# Patient Record
Sex: Male | Born: 1950
Health system: Southern US, Community
[De-identification: ages and names within clinical notes are randomized; demographics above are authoritative.]

## PROBLEM LIST (undated history)

## (undated) DIAGNOSIS — N189 Chronic kidney disease, unspecified: Secondary | ICD-10-CM

## (undated) DIAGNOSIS — Z87442 Personal history of urinary calculi: Secondary | ICD-10-CM

## (undated) DIAGNOSIS — I509 Heart failure, unspecified: Secondary | ICD-10-CM

## (undated) DIAGNOSIS — K469 Unspecified abdominal hernia without obstruction or gangrene: Secondary | ICD-10-CM

## (undated) DIAGNOSIS — F4310 Post-traumatic stress disorder, unspecified: Secondary | ICD-10-CM

## (undated) DIAGNOSIS — G4730 Sleep apnea, unspecified: Secondary | ICD-10-CM

## (undated) DIAGNOSIS — I10 Essential (primary) hypertension: Secondary | ICD-10-CM

## (undated) DIAGNOSIS — D649 Anemia, unspecified: Secondary | ICD-10-CM

## (undated) DIAGNOSIS — R0600 Dyspnea, unspecified: Secondary | ICD-10-CM

## (undated) DIAGNOSIS — K219 Gastro-esophageal reflux disease without esophagitis: Secondary | ICD-10-CM

## (undated) DIAGNOSIS — R011 Cardiac murmur, unspecified: Secondary | ICD-10-CM

## (undated) HISTORY — PX: AV FISTULA PLACEMENT: SHX1204

## (undated) HISTORY — PX: COLONOSCOPY: SHX174

## (undated) HISTORY — PX: CARDIAC CATHETERIZATION: SHX172

## (undated) HISTORY — PX: INSERTION OF DIALYSIS CATHETER: SHX1324

---

## 2002-06-13 ENCOUNTER — Ambulatory Visit (HOSPITAL_COMMUNITY): Admission: RE | Admit: 2002-06-13 | Discharge: 2002-06-13 | Payer: Self-pay | Admitting: Family Medicine

## 2002-06-13 ENCOUNTER — Encounter: Payer: Self-pay | Admitting: Family Medicine

## 2011-02-14 ENCOUNTER — Encounter (HOSPITAL_COMMUNITY): Payer: Non-veteran care | Attending: Nephrology

## 2011-02-14 DIAGNOSIS — D509 Iron deficiency anemia, unspecified: Secondary | ICD-10-CM | POA: Insufficient documentation

## 2011-02-28 ENCOUNTER — Encounter (HOSPITAL_COMMUNITY): Payer: Non-veteran care

## 2011-07-10 ENCOUNTER — Other Ambulatory Visit (HOSPITAL_COMMUNITY): Payer: Self-pay | Admitting: *Deleted

## 2011-07-11 ENCOUNTER — Encounter (HOSPITAL_COMMUNITY)
Admission: RE | Admit: 2011-07-11 | Discharge: 2011-07-11 | Disposition: A | Discharge status: Home / Self Care | Payer: Non-veteran care | Source: Ambulatory Visit | Attending: Nephrology | Admitting: Nephrology

## 2011-07-11 DIAGNOSIS — D509 Iron deficiency anemia, unspecified: Secondary | ICD-10-CM | POA: Insufficient documentation

## 2011-07-11 MED ORDER — SODIUM CHLORIDE 0.9 % IV SOLN
200.0000 mg | INTRAVENOUS | Status: AC
  Administered 2011-07-11: 200 mg via INTRAVENOUS
  Filled 2011-07-11: qty 10

## 2011-07-18 ENCOUNTER — Encounter (HOSPITAL_COMMUNITY)
Admission: RE | Admit: 2011-07-18 | Discharge: 2011-07-18 | Disposition: A | Discharge status: Home / Self Care | Payer: Non-veteran care | Source: Ambulatory Visit | Attending: Nephrology | Admitting: Nephrology

## 2011-07-18 MED ORDER — SODIUM CHLORIDE 0.9 % IV SOLN
200.0000 mg | INTRAVENOUS | Status: DC
  Administered 2011-07-18: 200 mg via INTRAVENOUS
  Filled 2011-07-18 (×2): qty 10

## 2012-11-27 ENCOUNTER — Other Ambulatory Visit (HOSPITAL_COMMUNITY): Payer: Self-pay | Admitting: *Deleted

## 2012-11-28 ENCOUNTER — Ambulatory Visit (HOSPITAL_COMMUNITY)
Admission: RE | Admit: 2012-11-28 | Discharge: 2012-11-28 | Disposition: A | Discharge status: Home / Self Care | Payer: Non-veteran care | Source: Ambulatory Visit | Attending: Nephrology | Admitting: Nephrology

## 2012-11-28 DIAGNOSIS — D509 Iron deficiency anemia, unspecified: Secondary | ICD-10-CM | POA: Insufficient documentation

## 2012-11-28 MED ORDER — SODIUM CHLORIDE 0.9 % IV SOLN
INTRAVENOUS | Status: DC
  Administered 2012-11-28: 11:00:00 via INTRAVENOUS

## 2012-11-28 MED ORDER — SODIUM CHLORIDE 0.9 % IV SOLN
1020.0000 mg | Freq: Once | INTRAVENOUS | Status: AC
  Administered 2012-11-28: 1020 mg via INTRAVENOUS
  Filled 2012-11-28: qty 34

## 2015-11-30 ENCOUNTER — Other Ambulatory Visit: Payer: Self-pay | Admitting: Vascular Surgery

## 2015-11-30 DIAGNOSIS — T82510D Breakdown (mechanical) of surgically created arteriovenous fistula, subsequent encounter: Secondary | ICD-10-CM

## 2015-12-01 ENCOUNTER — Other Ambulatory Visit (HOSPITAL_COMMUNITY): Payer: Self-pay | Admitting: Nephrology

## 2015-12-01 DIAGNOSIS — N186 End stage renal disease: Secondary | ICD-10-CM

## 2015-12-02 ENCOUNTER — Encounter (HOSPITAL_COMMUNITY): Payer: Self-pay | Admitting: Interventional Radiology

## 2015-12-02 ENCOUNTER — Ambulatory Visit (HOSPITAL_COMMUNITY)
Admission: RE | Admit: 2015-12-02 | Discharge: 2015-12-02 | Disposition: A | Discharge status: Home / Self Care | Payer: Non-veteran care | Source: Ambulatory Visit | Attending: Nephrology | Admitting: Nephrology

## 2015-12-02 DIAGNOSIS — Z452 Encounter for adjustment and management of vascular access device: Secondary | ICD-10-CM | POA: Insufficient documentation

## 2015-12-02 DIAGNOSIS — N186 End stage renal disease: Secondary | ICD-10-CM

## 2015-12-02 DIAGNOSIS — Z992 Dependence on renal dialysis: Secondary | ICD-10-CM | POA: Insufficient documentation

## 2015-12-02 HISTORY — PX: IR GENERIC HISTORICAL: IMG1180011

## 2015-12-02 IMAGING — XA IR FLUORO GUIDE CV LINE*L*
1 series · 1 of 1 positions shown · non-contrast
Comparison: none

INDICATION: 64-year-old male with end-stage renal disease on hemodialysis. He
currently dialysis via a left IJ approach tunneled hemodialysis
catheter which was placed by a surgeon at an outside facility
approximately 2 weeks previously. He has had 3 successful dialysis
sessions, however on the last session he was registering poor flows
and frequent alarms. He presents for catheter evaluation and
possible exchange.

[Series 1: fl (-) angio · 1 of 1 slices shown]
[im 1/1]
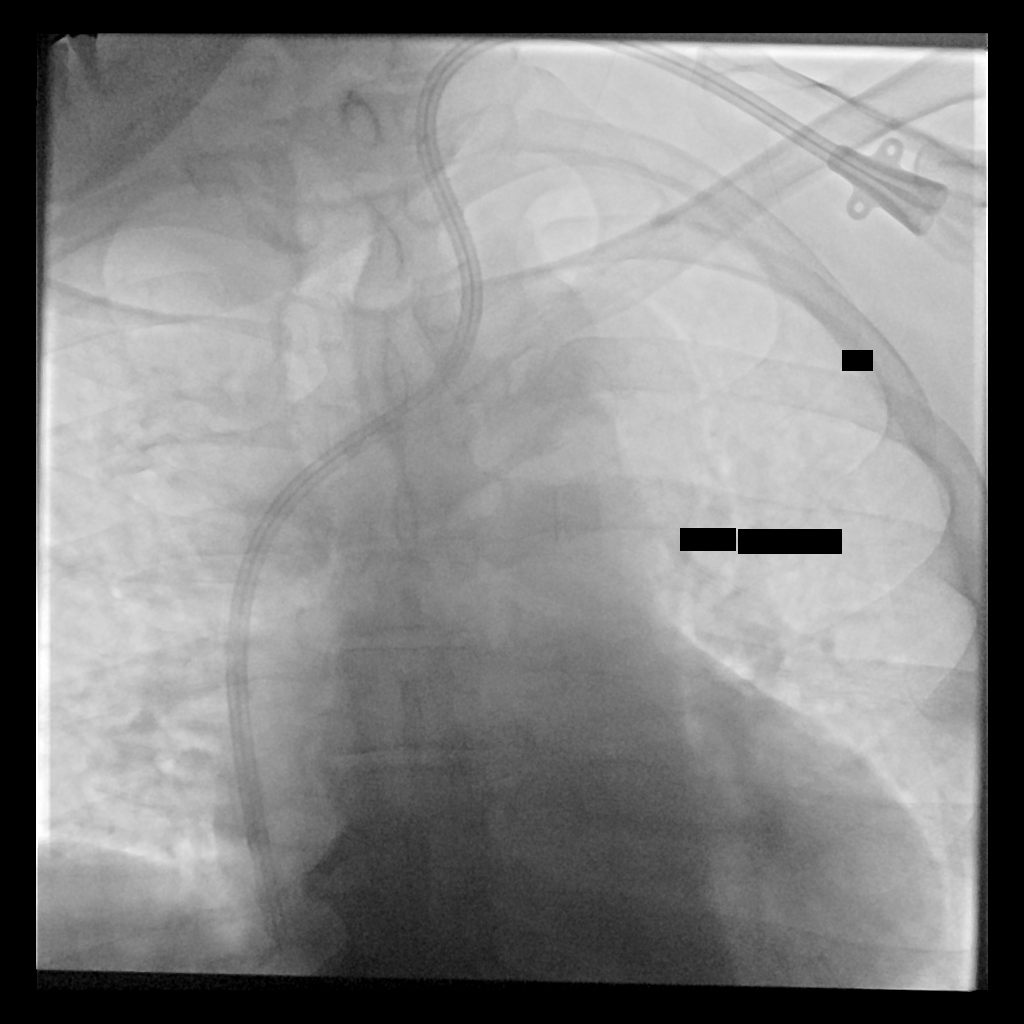

[1 of 1 positions shown; findings below may reference images not displayed]

EXAM:
IR LEFT FLOURO GUIDE TUNNELED HD CATHETER EXCHANGE

MEDICATIONS:
2 g Ancef; The antibiotic was administered within an appropriate
time interval prior to skin puncture.

ANESTHESIA/SEDATION:
None

FLUOROSCOPY TIME:  Fluoroscopy Time: 1 minutes 30 seconds (6 mGy).

COMPLICATIONS:
None immediate.

Estimated blood loss:  0

PROCEDURE:
Informed written consent was obtained from the patient after a
thorough discussion of the procedural risks, benefits and
alternatives. All questions were addressed. Maximal Sterile Barrier
Technique was utilized including caps, mask, sterile gowns, sterile
gloves, sterile drape, hand hygiene and skin antiseptic. A timeout
was performed prior to the initiation of the procedure.

An initial fluoroscopic image was obtained. This demonstrates that
the existing catheter is short with the catheter tip terminating in
the left innominate vein.

The catheter was freed from its retaining sutures and a subcutaneous
cuff was freed from the surrounding soft tissues following
administration of local anesthesia with 1% lidocaine.

The catheter was then removed over a stiff Glidewire. A new 28 cm
Palindrome tunneled hemodialysis catheter was advanced over the wire
and positioned with the catheter tip in the right atrium. The
catheter flushed and aspirated with ease. It was flushed with saline
and then concentrated heparin saline at 1,000 units/ml. The catheter
was secured to the skin with 0 Prolene suture and a sterile bandage
was applied. The patient tolerated the procedure well.
IMPRESSION: 1. Existing catheter tip is positioned in the left innominate vein
abutting the venous wall. This likely inhibits adequate flow during
dialysis.
2. Successful exchange for a new 28 cm Palindrome tunneled
hemodialysis catheter with the catheter tip position in the right
atrium. The catheter flushes and aspirates with ease and is ready
for immediate use.

## 2015-12-02 MED ORDER — CEFAZOLIN SODIUM-DEXTROSE 2-4 GM/100ML-% IV SOLN
INTRAVENOUS | Status: AC
  Administered 2015-12-02: 2000 mg
  Filled 2015-12-02: qty 100

## 2015-12-02 MED ORDER — LIDOCAINE HCL 1 % IJ SOLN
INTRAMUSCULAR | Status: AC
  Filled 2015-12-02: qty 20

## 2015-12-02 MED ORDER — CHLORHEXIDINE GLUCONATE 4 % EX LIQD
CUTANEOUS | Status: AC
  Filled 2015-12-02: qty 15

## 2015-12-02 MED ORDER — HEPARIN SODIUM (PORCINE) 1000 UNIT/ML IJ SOLN
INTRAMUSCULAR | Status: AC
  Filled 2015-12-02: qty 1

## 2015-12-02 NOTE — Procedures (Signed)
Interventional Radiology Procedure Note  Procedure: Exchange for a new, longer tunneled HD catheter.  New device is a 28 cm tip-to-cuff Palindrome.  Tip in RA and ready for use.   Complications: None  Estimated Blood Loss: None  Recommendations: - Routine line care - Remove retaining sutures in 14 days  Signed,  Criselda Peaches, MD

## 2015-12-09 ENCOUNTER — Encounter: Payer: Self-pay | Admitting: Vascular Surgery

## 2015-12-17 ENCOUNTER — Encounter: Payer: Self-pay | Admitting: Vascular Surgery

## 2015-12-17 ENCOUNTER — Ambulatory Visit (HOSPITAL_COMMUNITY)
Admission: RE | Admit: 2015-12-17 | Discharge: 2015-12-17 | Disposition: A | Discharge status: Home / Self Care | Payer: No Typology Code available for payment source | Source: Ambulatory Visit | Attending: Vascular Surgery | Admitting: Vascular Surgery

## 2015-12-17 ENCOUNTER — Ambulatory Visit (INDEPENDENT_AMBULATORY_CARE_PROVIDER_SITE_OTHER): Payer: Non-veteran care | Admitting: Vascular Surgery

## 2015-12-17 DIAGNOSIS — N186 End stage renal disease: Secondary | ICD-10-CM

## 2015-12-17 DIAGNOSIS — T82510D Breakdown (mechanical) of surgically created arteriovenous fistula, subsequent encounter: Secondary | ICD-10-CM | POA: Diagnosis not present

## 2015-12-17 DIAGNOSIS — Z992 Dependence on renal dialysis: Secondary | ICD-10-CM | POA: Diagnosis not present

## 2015-12-17 DIAGNOSIS — Z9889 Other specified postprocedural states: Secondary | ICD-10-CM | POA: Insufficient documentation

## 2015-12-17 NOTE — Progress Notes (Signed)
Referred by:  Dr. Lowanda Foster (Nephrology)  Reason for referral: check maturation of R RC AVF  History of Present Illness  Kevin Walker is a 65 y.o. ([birth year]) male who presents for evaluation of R RC AVF.  The patient is right hand dominant.  The patient has had a failed L RC AVF and a R RC AVF placed at [facility] on 01/28/15.  Previous central venous cannulation procedures include: none.  The patient has never had a PPM placed.  Report attempts to cannulate the R RC AVF have failed  Past Medical History: ESRD, HTN  Past Surgical History:  Procedure Laterality Date  . IR GENERIC HISTORICAL  12/02/2015   IR FLUORO GUIDE CV LINE LEFT 12/02/2015 Kevin Cadet, MD [facility]INTER[facility]  R RC AVF  Social History   Social History  . Marital status: Married    Spouse name: N/A  . Number of children: N/A  . Years of education: N/A   Occupational History  . Not on file.   Social History Main Topics  . Smoking status: Never Smoker  . Smokeless tobacco: Never Used  . Alcohol use Not on file  . Drug use: Unknown  . Sexual activity: Not on file   Other Topics Concern  . Not on file   Social History Narrative  . No narrative on file    Family History: cancer in parents   Current Outpatient Prescriptions  Medication Sig Dispense Refill  . amLODipine (NORVASC) 10 MG tablet Take 10 mg by mouth daily.    . calcitRIOL (ROCALTROL) 0.25 MCG capsule Take 0.25 mcg by mouth daily.    . febuxostat (ULORIC) 40 MG tablet Take 40 mg by mouth daily.    . ferrous sulfate 325 (65 FE) MG tablet Take 325 mg by mouth daily with breakfast.    . furosemide (LASIX) 40 MG tablet Take 40 mg by mouth.    [name] omeprazole (PRILOSEC) 20 MG capsule Take 20 mg by mouth daily.    . sevelamer (RENAGEL) 800 MG tablet Take 1,600 mg by mouth 3 (three) times daily with meals.    . sevelamer carbonate (RENVELA) 800 MG tablet Take 800 mg by mouth 2 (two) times daily.     No current facility-administered medications  for this visit.     No Known Allergies  REVIEW OF SYSTEMS:  (Positives checked otherwise negative)  CARDIOVASCULAR:   [ ]  chest pain,  [ ]  chest pressure,  [ ]  palpitations,  [ ]  shortness of breath when laying flat,  [ ]  shortness of breath with exertion,   [ ]  pain in feet when walking,  [ ]  pain in feet when laying flat, [ ]  history of blood clot in veins (DVT),  [ ]  history of phlebitis,  [ ]  swelling in legs,  [ ]  varicose veins  PULMONARY:   [ ]  productive cough,  [ ]  asthma,  [ ]  wheezing  NEUROLOGIC:   [ ]  weakness in arms or legs,  [ ]  numbness in arms or legs,  [ ]  difficulty speaking or slurred speech,  [ ]  temporary loss of vision in one eye,  [ ]  dizziness  HEMATOLOGIC:   [ ]  bleeding problems,  [ ]  problems with blood clotting too easily  MUSCULOSKEL:   [ ]  joint pain, [ ]  joint swelling  GASTROINTEST:   [ ]  vomiting blood,  [ ]  blood in stool     GENITOURINARY:   [ ]  burning with urination,  [ ]   blood in urine [x]  ESRD-HD: T/R/S  PSYCHIATRIC:   [ ]  history of major depression  INTEGUMENTARY:   [ ]  rashes,  [ ]  ulcers  CONSTITUTIONAL:   [ ]  fever,  [ ]  chills   Physical Examination  Vitals:   12/17/15 0937 12/17/15 0941  BP: (!) 146/80 130/80  Pulse: (!) 52   Resp: 16   Temp: 97.8 F (36.6 C)   TempSrc: Oral   SpO2: 100%   Weight: 224 lb (101.6 kg)   Height: 6' (1.829 m)    Body mass index is 30.38 kg/m.  General: A&O x 3, WDWN  Head: [place]/AT  Ear/Nose/Throat: Hearing grossly intact, nares w/o erythema or drainage, oropharynx w/o Erythema/Exudate, Mallampati score: 3  Eyes: PERRLA, EOMI  Neck: Supple, no nuchal rigidity, no palpable LAD  Pulmonary: Sym exp, good air movt, CTAB, no rales, rhonchi, & wheezing  Cardiac: RRR, Nl S1, S2, no Murmurs, rubs or gallops  Vascular: Vessel Right Left  Radial Palpable Palpable  Ulnar Not Palpable Not Palpable  Brachial Palpable Palpable  Carotid Palpable, without bruit  Palpable, without bruit  Aorta Not palpable N/A  Femoral Palpable Palpable  Popliteal Not palpable Not palpable  PT Faintly Palpable Faintly Palpable  DP Faintly Palpable Faintly Palpable   L RC AVF with thrill and bruit, on Sonosite: >6 mm throughout most of length  Gastrointestinal: soft, NTND, -G/R, - HSM, - masses, - CVAT B  Musculoskeletal: M/S 5/5 throughout , Extremities without obvious ischemic changes , both calf covered with compression stockings  Neurologic: CN 2-12 intact , Pain and light touch intact in extremities , Motor exam as listed above  Psychiatric: Judgment intact, Mood & affect appropriate for pt's clinical situation  Dermatologic: See M/S exam for extremity exam, no rashes otherwise noted  Lymph : No Cervical, Axillary, or Inguinal lymphadenopathy   Outside Studies/Documentation 10 pages of outside documents were reviewed including: outside practice including Kevin Walker is a 65 y.o. male who presents with ESRD requiring hemodialysis.   The Right radiocephalic arteriovenous fistula appears adequate size for start of cannulation.  If cannulation continues to be a problem, might consider fistulogram of R RC AVF +/- balloon assisted maturation  Thank you for the consult.  Pt can follow up as needed.  Kevin Barthel, MD [facility] Office: [phone] Pager: [phone]  12/17/2015, 12:26 PM

## 2016-02-24 ENCOUNTER — Other Ambulatory Visit (HOSPITAL_COMMUNITY): Payer: Self-pay | Admitting: Nephrology

## 2016-02-24 DIAGNOSIS — N186 End stage renal disease: Secondary | ICD-10-CM

## 2016-03-01 ENCOUNTER — Inpatient Hospital Stay (HOSPITAL_COMMUNITY): Admission: RE | Admit: 2016-03-01 | Payer: Non-veteran care | Source: Ambulatory Visit

## 2016-03-06 ENCOUNTER — Encounter (HOSPITAL_COMMUNITY): Payer: Self-pay | Admitting: Diagnostic Radiology

## 2016-03-06 ENCOUNTER — Ambulatory Visit (HOSPITAL_COMMUNITY)
Admission: RE | Admit: 2016-03-06 | Discharge: 2016-03-06 | Disposition: A | Discharge status: Home / Self Care | Payer: Non-veteran care | Source: Ambulatory Visit | Attending: Diagnostic Radiology | Admitting: Diagnostic Radiology

## 2016-03-06 DIAGNOSIS — Z4901 Encounter for fitting and adjustment of extracorporeal dialysis catheter: Secondary | ICD-10-CM | POA: Insufficient documentation

## 2016-03-06 DIAGNOSIS — N186 End stage renal disease: Secondary | ICD-10-CM | POA: Diagnosis not present

## 2016-03-06 HISTORY — PX: IR GENERIC HISTORICAL: IMG1180011

## 2016-03-06 MED ORDER — LIDOCAINE HCL 1 % IJ SOLN
INTRAMUSCULAR | Status: AC
  Filled 2016-03-06: qty 20

## 2016-03-06 MED ORDER — CHLORHEXIDINE GLUCONATE 4 % EX LIQD
CUTANEOUS | Status: AC
  Administered 2016-03-06: 09:00:00
  Filled 2016-03-06: qty 15

## 2016-03-06 MED ORDER — LIDOCAINE HCL 1 % IJ SOLN
INTRAMUSCULAR | Status: DC | PRN
  Administered 2016-03-06: 10 mL

## 2017-06-01 ENCOUNTER — Encounter: Payer: Self-pay | Admitting: Vascular Surgery

## 2017-06-01 ENCOUNTER — Ambulatory Visit (INDEPENDENT_AMBULATORY_CARE_PROVIDER_SITE_OTHER): Payer: Non-veteran care | Admitting: Vascular Surgery

## 2017-06-01 ENCOUNTER — Other Ambulatory Visit: Payer: Self-pay | Admitting: *Deleted

## 2017-06-01 ENCOUNTER — Encounter: Payer: Self-pay | Admitting: *Deleted

## 2017-06-01 VITALS — BP 159/94 | HR 62 | Temp 98.4°F | Resp 16 | Ht 72.0 in | Wt 216.0 lb

## 2017-06-01 DIAGNOSIS — Z992 Dependence on renal dialysis: Secondary | ICD-10-CM | POA: Diagnosis not present

## 2017-06-01 DIAGNOSIS — I871 Compression of vein: Secondary | ICD-10-CM

## 2017-06-01 DIAGNOSIS — N186 End stage renal disease: Secondary | ICD-10-CM | POA: Diagnosis not present

## 2017-06-01 NOTE — Progress Notes (Signed)
Requested by:  [facility] [address] [place], [place] [address]  Reason for consultation: "Whistling" in R arm access  History of Present Illness   Kevin Walker is a 67 y.o. (1951-03-25) male RHD who presents for cc: whistling in R arm access.  Pt had a R RC AVF placed 2.5 years ago.  He has had no flow rate issues or bleeding complications.  He denies any steal syndrome in his right hand.  Recently he was told has a high pitch sound in his fistula and subsequently was referred for evaluation of such.    Past Medical History: ESRD-HD: T/R/S  Past Surgical History:  Procedure Laterality Date  . IR GENERIC HISTORICAL  12/02/2015   IR FLUORO GUIDE CV LINE LEFT 12/02/2015 Jacqulynn Cadet, MD [facility]  . IR GENERIC HISTORICAL  03/06/2016   IR REMOVAL TUN CV CATH W/O FL 03/06/2016 Markus Daft, MD [facility]  R RC AVF  Social History   Socioeconomic History  . Marital status: Married    Spouse name: Not on file  . Number of children: Not on file  . Years of education: Not on file  . Highest education level: Not on file  Social Needs  . Financial resource strain: Not on file  . Food insecurity - worry: Not on file  . Food insecurity - inability: Not on file  . Transportation needs - medical: Not on file  . Transportation needs - non-medical: Not on file  Occupational History  . Not on file  Tobacco Use  . Smoking status: Never Smoker  . Smokeless tobacco: Never Used  Substance and Sexual Activity  . Alcohol use: Not on file  . Drug use: Not on file  . Sexual activity: Not on file  Other Topics Concern  . Not on file  Social History Narrative  . Not on file   Family History: patient is unable to detail the medical history of his parents  Current Outpatient Medications  Medication Sig Dispense Refill  . amLODipine (NORVASC) 10 MG tablet Take 10 mg by mouth daily.    . calcitRIOL (ROCALTROL) 0.25 MCG capsule Take 0.25 mcg by  mouth daily.    . febuxostat (ULORIC) 40 MG tablet Take 40 mg by mouth daily.    . ferrous sulfate 325 (65 FE) MG tablet Take 325 mg by mouth daily with breakfast.    . furosemide (LASIX) 40 MG tablet Take 40 mg by mouth.    [name] omeprazole (PRILOSEC) 20 MG capsule Take 20 mg by mouth daily.    . sevelamer (RENAGEL) 800 MG tablet Take 1,600 mg by mouth 3 (three) times daily with meals.    . sevelamer carbonate (RENVELA) 800 MG tablet Take 800 mg by mouth 2 (two) times daily.     No current facility-administered medications for this visit.     No Known Allergies  REVIEW OF SYSTEMS (negative unless checked):   Cardiac:  []  Chest pain or chest pressure? []  Shortness of breath upon activity? []  Shortness of breath when lying flat? []  Irregular heart rhythm?  Vascular:  []  Pain in calf, thigh, or hip brought on by walking? []  Pain in feet at night that wakes you up from your sleep? []  Blood clot in your veins? []  Leg swelling?  Pulmonary:  []  Oxygen at home? []  Productive cough? []  Wheezing?  Neurologic:  []  Sudden weakness in arms or legs? []  Sudden numbness in arms or legs? []  Sudden onset of difficult  speaking or slurred speech? []  Temporary loss of vision in one eye? []  Problems with dizziness?  Gastrointestinal:  []  Blood in stool? []  Vomited blood?  Genitourinary:  []  Burning when urinating? []  Blood in urine? [x]   End stage renal disease-HD: T/R/S  Psychiatric:  []  Major depression  Hematologic:  []  Bleeding problems? []  Problems with blood clotting?  Dermatologic:  []  Rashes or ulcers?  Constitutional:  []  Fever or chills?  Ear/Nose/Throat:  []  Change in hearing? []  Nose bleeds? []  Sore throat?  Musculoskeletal:  []  Back pain? []  Joint pain? []  Muscle pain?   Physical Examination     Vitals:   06/01/17 1514  BP: (!) 159/94  Pulse: 62  Resp: 16  Temp: 98.4 F (36.9 C)  TempSrc: Oral  SpO2: 97%  Weight: 216 lb (98 kg)  Height: 6'  (1.829 m)   Body mass index is 29.29 kg/m.  General Alert, O x 3, WD, NAD  Head [place]/AT,    Ear/Nose/ Throat Hearing grossly intact, nares without erythema or drainage, oropharynx without Erythema or Exudate, Mallampati score: 3,   Eyes PERRLA, EOMI,    Neck Supple, mid-line trachea,    Pulmonary Sym exp, good B air movt, CTA B  Cardiac RRR, Nl S1, S2, no Murmurs, No rubs, No S3,S4  Vascular Vessel Right Left  Radial Palpable Palpable  Brachial Palpable Palpable  Ulnar Not palpable Not palpable    Gastro- intestinal soft, non-distended, non-tender to palpation, No guarding or rebound, no HSM, no masses, no CVAT B, No palpable prominent aortic pulse,    Musculo- skeletal M/S 5/5 throughout  , Extremities without ischemic changes  , Non-pitting edema present: 1+ B, B compression stockings, palpable thrill in R RC AVF, normal bruit distally with higher pitch bruit proximally   Neurologic Pain and light touch intact in extremities , Motor exam as listed above  Psychiatric Judgement intact, Mood & affect appropriate for pt's clinical situation  Dermatologic See M/S exam for extremity exam, No rashes otherwise noted  Lymphatic  Palpable lymph nodes: None    Medical Decision Making   Kevin Walker is a 67 y.o. male who presents with end stage renal disease requiring HD, likely venous stenosis in R RC AVF  I recommend: R arm fistulogram, possible intervention. I discussed with the patient the nature of angiographic procedures, especially the limited patencies of any endovascular intervention.   The patient is aware of that the risks of an angiographic procedure include but are not limited to: bleeding, infection, access site complications, renal failure, embolization, rupture of vessel, dissection, arteriovenous fistula, possible need for emergent surgical intervention, possible need for surgical procedures to treat the patient's pathology, anaphylactic reaction to contrast, and stroke and  death.    The patient has agreed to proceed with the above procedure which will be scheduled 4 FEB 19.   [name], MD, FACS [facility] Office: [phone] Pager: [phone]  06/01/2017, 3:39 PM

## 2017-06-01 NOTE — H&P (View-Only) (Signed)
Requested by:  [facility] [address] [place], [place] [address]  Reason for consultation: "Whistling" in R arm access  History of Present Illness   Kevin Walker is a 67 y.o. (1951-03-09) male RHD who presents for cc: whistling in R arm access.  Pt had a R RC AVF placed 2.5 years ago.  He has had no flow rate issues or bleeding complications.  He denies any steal syndrome in his right hand.  Recently he was told has a high pitch sound in his fistula and subsequently was referred for evaluation of such.    Past Medical History: ESRD-HD: T/R/S  Past Surgical History:  Procedure Laterality Date  . IR GENERIC HISTORICAL  12/02/2015   IR FLUORO GUIDE CV LINE LEFT 12/02/2015 Jacqulynn Cadet, MD [facility]  . IR GENERIC HISTORICAL  03/06/2016   IR REMOVAL TUN CV CATH W/O FL 03/06/2016 Markus Daft, MD [facility]  R RC AVF  Social History   Socioeconomic History  . Marital status: Married    Spouse name: Not on file  . Number of children: Not on file  . Years of education: Not on file  . Highest education level: Not on file  Social Needs  . Financial resource strain: Not on file  . Food insecurity - worry: Not on file  . Food insecurity - inability: Not on file  . Transportation needs - medical: Not on file  . Transportation needs - non-medical: Not on file  Occupational History  . Not on file  Tobacco Use  . Smoking status: Never Smoker  . Smokeless tobacco: Never Used  Substance and Sexual Activity  . Alcohol use: Not on file  . Drug use: Not on file  . Sexual activity: Not on file  Other Topics Concern  . Not on file  Social History Narrative  . Not on file   Family History: patient is unable to detail the medical history of his parents  Current Outpatient Medications  Medication Sig Dispense Refill  . amLODipine (NORVASC) 10 MG tablet Take 10 mg by mouth daily.    . calcitRIOL (ROCALTROL) 0.25 MCG capsule Take 0.25 mcg by  mouth daily.    . febuxostat (ULORIC) 40 MG tablet Take 40 mg by mouth daily.    . ferrous sulfate 325 (65 FE) MG tablet Take 325 mg by mouth daily with breakfast.    . furosemide (LASIX) 40 MG tablet Take 40 mg by mouth.    [name] omeprazole (PRILOSEC) 20 MG capsule Take 20 mg by mouth daily.    . sevelamer (RENAGEL) 800 MG tablet Take 1,600 mg by mouth 3 (three) times daily with meals.    . sevelamer carbonate (RENVELA) 800 MG tablet Take 800 mg by mouth 2 (two) times daily.     No current facility-administered medications for this visit.     No Known Allergies  REVIEW OF SYSTEMS (negative unless checked):   Cardiac:  []  Chest pain or chest pressure? []  Shortness of breath upon activity? []  Shortness of breath when lying flat? []  Irregular heart rhythm?  Vascular:  []  Pain in calf, thigh, or hip brought on by walking? []  Pain in feet at night that wakes you up from your sleep? []  Blood clot in your veins? []  Leg swelling?  Pulmonary:  []  Oxygen at home? []  Productive cough? []  Wheezing?  Neurologic:  []  Sudden weakness in arms or legs? []  Sudden numbness in arms or legs? []  Sudden onset of difficult  speaking or slurred speech? []  Temporary loss of vision in one eye? []  Problems with dizziness?  Gastrointestinal:  []  Blood in stool? []  Vomited blood?  Genitourinary:  []  Burning when urinating? []  Blood in urine? [x]   End stage renal disease-HD: T/R/S  Psychiatric:  []  Major depression  Hematologic:  []  Bleeding problems? []  Problems with blood clotting?  Dermatologic:  []  Rashes or ulcers?  Constitutional:  []  Fever or chills?  Ear/Nose/Throat:  []  Change in hearing? []  Nose bleeds? []  Sore throat?  Musculoskeletal:  []  Back pain? []  Joint pain? []  Muscle pain?   Physical Examination     Vitals:   06/01/17 1514  BP: (!) 159/94  Pulse: 62  Resp: 16  Temp: 98.4 F (36.9 C)  TempSrc: Oral  SpO2: 97%  Weight: 216 lb (98 kg)  Height: 6'  (1.829 m)   Body mass index is 29.29 kg/m.  General Alert, O x 3, WD, NAD  Head [place]/AT,    Ear/Nose/ Throat Hearing grossly intact, nares without erythema or drainage, oropharynx without Erythema or Exudate, Mallampati score: 3,   Eyes PERRLA, EOMI,    Neck Supple, mid-line trachea,    Pulmonary Sym exp, good B air movt, CTA B  Cardiac RRR, Nl S1, S2, no Murmurs, No rubs, No S3,S4  Vascular Vessel Right Left  Radial Palpable Palpable  Brachial Palpable Palpable  Ulnar Not palpable Not palpable    Gastro- intestinal soft, non-distended, non-tender to palpation, No guarding or rebound, no HSM, no masses, no CVAT B, No palpable prominent aortic pulse,    Musculo- skeletal M/S 5/5 throughout  , Extremities without ischemic changes  , Non-pitting edema present: 1+ B, B compression stockings, palpable thrill in R RC AVF, normal bruit distally with higher pitch bruit proximally   Neurologic Pain and light touch intact in extremities , Motor exam as listed above  Psychiatric Judgement intact, Mood & affect appropriate for pt's clinical situation  Dermatologic See M/S exam for extremity exam, No rashes otherwise noted  Lymphatic  Palpable lymph nodes: None    Medical Decision Making   Vanden Fawaz is a 67 y.o. male who presents with end stage renal disease requiring HD, likely venous stenosis in R RC AVF  I recommend: R arm fistulogram, possible intervention. I discussed with the patient the nature of angiographic procedures, especially the limited patencies of any endovascular intervention.   The patient is aware of that the risks of an angiographic procedure include but are not limited to: bleeding, infection, access site complications, renal failure, embolization, rupture of vessel, dissection, arteriovenous fistula, possible need for emergent surgical intervention, possible need for surgical procedures to treat the patient's pathology, anaphylactic reaction to contrast, and stroke and  death.    The patient has agreed to proceed with the above procedure which will be scheduled 4 FEB 19.   [name], MD, FACS [facility] Office: [phone] Pager: [phone]  06/01/2017, 3:39 PM

## 2017-06-01 NOTE — Addendum Note (Signed)
Addended by: Conrad Dixon on: 06/01/2017 03:57 PM   Modules accepted: Level of Service

## 2017-06-08 ENCOUNTER — Encounter (HOSPITAL_COMMUNITY): Payer: Self-pay | Admitting: *Deleted

## 2017-06-08 ENCOUNTER — Other Ambulatory Visit: Payer: Self-pay

## 2017-06-08 NOTE — Progress Notes (Addendum)
Spoke with pt for pre-op call. Pt states he was told he had a heart murmur a few months ago. States he had some type of test and was told it was ok. 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 Will request records from them. Pt denies any other heart issues, chest pain or sob. Pt states he is not diabetic.  Pt is scheduled for a late afternoon surgery, I spoke with Dr. Therisa Doyne and he said pt could have a light breakfast (no meat) by 7:00 AM, then NPO. Pt was given this instruction.

## 2017-06-11 ENCOUNTER — Ambulatory Visit (HOSPITAL_COMMUNITY)
Admission: RE | Admit: 2017-06-11 | Discharge: 2017-06-11 | Disposition: A | Discharge status: Home / Self Care | Payer: Non-veteran care | Source: Ambulatory Visit | Attending: Vascular Surgery | Admitting: Vascular Surgery

## 2017-06-11 ENCOUNTER — Encounter (HOSPITAL_COMMUNITY)
Admission: RE | Disposition: A | Discharge status: Home / Self Care | Payer: Self-pay | Source: Ambulatory Visit | Attending: Vascular Surgery

## 2017-06-11 ENCOUNTER — Encounter (HOSPITAL_COMMUNITY): Payer: Self-pay

## 2017-06-11 ENCOUNTER — Ambulatory Visit (HOSPITAL_COMMUNITY): Payer: Non-veteran care | Admitting: Anesthesiology

## 2017-06-11 ENCOUNTER — Other Ambulatory Visit: Payer: Self-pay

## 2017-06-11 DIAGNOSIS — K219 Gastro-esophageal reflux disease without esophagitis: Secondary | ICD-10-CM | POA: Diagnosis not present

## 2017-06-11 DIAGNOSIS — N186 End stage renal disease: Secondary | ICD-10-CM | POA: Insufficient documentation

## 2017-06-11 DIAGNOSIS — I120 Hypertensive chronic kidney disease with stage 5 chronic kidney disease or end stage renal disease: Secondary | ICD-10-CM | POA: Insufficient documentation

## 2017-06-11 DIAGNOSIS — Y832 Surgical operation with anastomosis, bypass or graft as the cause of abnormal reaction of the patient, or of later complication, without mention of misadventure at the time of the procedure: Secondary | ICD-10-CM | POA: Diagnosis not present

## 2017-06-11 DIAGNOSIS — T82858A Stenosis of vascular prosthetic devices, implants and grafts, initial encounter: Secondary | ICD-10-CM | POA: Insufficient documentation

## 2017-06-11 DIAGNOSIS — T82898A Other specified complication of vascular prosthetic devices, implants and grafts, initial encounter: Secondary | ICD-10-CM | POA: Diagnosis not present

## 2017-06-11 HISTORY — DX: Essential (primary) hypertension: I10

## 2017-06-11 HISTORY — DX: Cardiac murmur, unspecified: R01.1

## 2017-06-11 HISTORY — DX: Personal history of urinary calculi: Z87.442

## 2017-06-11 HISTORY — DX: Gastro-esophageal reflux disease without esophagitis: K21.9

## 2017-06-11 HISTORY — DX: Sleep apnea, unspecified: G47.30

## 2017-06-11 HISTORY — PX: FISTULOGRAM: SHX5832

## 2017-06-11 HISTORY — DX: Chronic kidney disease, unspecified: N18.9

## 2017-06-11 HISTORY — DX: Anemia, unspecified: D64.9

## 2017-06-11 LAB — POCT I-STAT 4, (NA,K, GLUC, HGB,HCT)
Glucose, Bld: 98 mg/dL (ref 65–99)
HEMATOCRIT: 41 % (ref 39.0–52.0)
Hemoglobin: 13.9 g/dL (ref 13.0–17.0)
Potassium: 7.7 mmol/L (ref 3.5–5.1)
SODIUM: 135 mmol/L (ref 135–145)

## 2017-06-11 LAB — BASIC METABOLIC PANEL
Anion gap: 13 (ref 5–15)
BUN: 34 mg/dL — ABNORMAL HIGH (ref 6–20)
CHLORIDE: 98 mmol/L — AB (ref 101–111)
CO2: 28 mmol/L (ref 22–32)
CREATININE: 10.51 mg/dL — AB (ref 0.61–1.24)
Calcium: 9.4 mg/dL (ref 8.9–10.3)
GFR calc non Af Amer: 4 mL/min — ABNORMAL LOW (ref >60)
GFR, EST AFRICAN AMERICAN: 5 mL/min — AB (ref >60)
Glucose, Bld: 95 mg/dL (ref 65–99)
Potassium: 4.5 mmol/L (ref 3.5–5.1)
Sodium: 139 mmol/L (ref 135–145)

## 2017-06-11 SURGERY — FISTULOGRAM
Anesthesia: Monitor Anesthesia Care | Site: Arm Lower | Laterality: Right

## 2017-06-11 MED ORDER — SODIUM CHLORIDE 0.9% FLUSH: 3.0000 mL | Freq: Two times a day (BID) | INTRAVENOUS | Status: DC

## 2017-06-11 MED ORDER — FENTANYL CITRATE (PF) 250 MCG/5ML IJ SOLN
INTRAMUSCULAR | Status: AC
  Filled 2017-06-11: qty 5

## 2017-06-11 MED ORDER — IODIXANOL 320 MG/ML IV SOLN
INTRAVENOUS | Status: DC | PRN
  Administered 2017-06-11: 33 mL

## 2017-06-11 MED ORDER — LIDOCAINE 2% (20 MG/ML) 5 ML SYRINGE
INTRAMUSCULAR | Status: DC | PRN
  Administered 2017-06-11: 20 mg via INTRAVENOUS

## 2017-06-11 MED ORDER — PROPOFOL 500 MG/50ML IV EMUL
INTRAVENOUS | Status: DC | PRN
  Administered 2017-06-11: 75 ug/kg/min via INTRAVENOUS

## 2017-06-11 MED ORDER — HYDRALAZINE HCL 20 MG/ML IJ SOLN: 5.0000 mg | INTRAMUSCULAR | Status: DC | PRN

## 2017-06-11 MED ORDER — HEPARIN SODIUM (PORCINE) 1000 UNIT/ML IJ SOLN
INTRAMUSCULAR | Status: DC | PRN
  Administered 2017-06-11: 3000 [IU] via INTRAVENOUS

## 2017-06-11 MED ORDER — SODIUM CHLORIDE 0.9% FLUSH: 3.0000 mL | INTRAVENOUS | Status: DC | PRN

## 2017-06-11 MED ORDER — SODIUM CHLORIDE 0.9 % IV SOLN: INTRAVENOUS | Status: DC

## 2017-06-11 MED ORDER — SODIUM CHLORIDE 0.9 % IV SOLN
INTRAVENOUS | Status: DC | PRN
  Administered 2017-06-11: 17:00:00

## 2017-06-11 MED ORDER — FENTANYL CITRATE (PF) 100 MCG/2ML IJ SOLN
INTRAMUSCULAR | Status: DC | PRN
  Administered 2017-06-11: 50 ug via INTRAVENOUS

## 2017-06-11 MED ORDER — HEPARIN SODIUM (PORCINE) 1000 UNIT/ML IJ SOLN
INTRAMUSCULAR | Status: AC
  Filled 2017-06-11: qty 1

## 2017-06-11 MED ORDER — LABETALOL HCL 5 MG/ML IV SOLN: 10.0000 mg | INTRAVENOUS | Status: DC | PRN

## 2017-06-11 MED ORDER — PHENYLEPHRINE HCL 10 MG/ML IJ SOLN
INTRAVENOUS | Status: DC | PRN
  Administered 2017-06-11: 10 ug/min via INTRAVENOUS

## 2017-06-11 MED ORDER — LIDOCAINE HCL (PF) 1 % IJ SOLN
INTRAMUSCULAR | Status: AC
  Filled 2017-06-11: qty 30

## 2017-06-11 MED ORDER — PROPOFOL 10 MG/ML IV BOLUS
INTRAVENOUS | Status: DC | PRN
  Administered 2017-06-11: 20 mg via INTRAVENOUS

## 2017-06-11 MED ORDER — SODIUM CHLORIDE 0.9 % IV SOLN: 250.0000 mL | INTRAVENOUS | Status: DC | PRN

## 2017-06-11 MED ORDER — MIDAZOLAM HCL 2 MG/2ML IJ SOLN
INTRAMUSCULAR | Status: AC
  Filled 2017-06-11: qty 2

## 2017-06-11 MED ORDER — SODIUM CHLORIDE 0.9 % IV SOLN
INTRAVENOUS | Status: DC | PRN
  Administered 2017-06-11: 15:00:00 via INTRAVENOUS

## 2017-06-11 MED ORDER — LIDOCAINE HCL 1 % IJ SOLN
INTRAMUSCULAR | Status: DC | PRN
  Administered 2017-06-11: 2.5 mL

## 2017-06-11 SURGICAL SUPPLY — 45 items
BAG BANDED W/RUBBER/TAPE 36X54 (MISCELLANEOUS) ×3 IMPLANT
BALLN MUSTANG 6X80X75 (BALLOONS) ×3
BALLOON MUSTANG 6X80X75 (BALLOONS) ×1 IMPLANT
BLADE SURG 11 STRL SS (BLADE) ×3 IMPLANT
CANISTER SUCT 3000ML PPV (MISCELLANEOUS) IMPLANT
CATH ANGIO 5F BER2 65CM (CATHETERS) ×3 IMPLANT
COVER DOME SNAP 22 D (MISCELLANEOUS) ×6 IMPLANT
COVER PROBE W GEL 5X96 (DRAPES) ×3 IMPLANT
DERMABOND ADVANCED (GAUZE/BANDAGES/DRESSINGS) ×2
DERMABOND ADVANCED .7 DNX12 (GAUZE/BANDAGES/DRESSINGS) ×1 IMPLANT
DRSG TEGADERM 2-3/8X2-3/4 SM (GAUZE/BANDAGES/DRESSINGS) ×3 IMPLANT
ELECT REM PT RETURN 9FT ADLT (ELECTROSURGICAL)
ELECTRODE REM PT RTRN 9FT ADLT (ELECTROSURGICAL) IMPLANT
GAUZE SPONGE 2X2 8PLY STRL LF (GAUZE/BANDAGES/DRESSINGS) ×1 IMPLANT
GLOVE BIO SURGEON STRL SZ7 (GLOVE) ×3 IMPLANT
GLOVE BIOGEL PI IND STRL 7.5 (GLOVE) ×1 IMPLANT
GLOVE BIOGEL PI INDICATOR 7.5 (GLOVE) ×2
GOWN STRL REUS W/ TWL LRG LVL3 (GOWN DISPOSABLE) ×3 IMPLANT
GOWN STRL REUS W/TWL LRG LVL3 (GOWN DISPOSABLE) ×6
KIT BASIN OR (CUSTOM PROCEDURE TRAY) IMPLANT
KIT ENCORE 26 ADVANTAGE (KITS) ×3 IMPLANT
KIT ROOM TURNOVER OR (KITS) ×3 IMPLANT
NEEDLE PERC 18GX7CM (NEEDLE) ×3 IMPLANT
NS IRRIG 1000ML POUR BTL (IV SOLUTION) ×3 IMPLANT
PACK SURGICAL SETUP 50X90 (CUSTOM PROCEDURE TRAY) IMPLANT
PAD ARMBOARD 7.5X6 YLW CONV (MISCELLANEOUS) ×6 IMPLANT
PROTECTION STATION PRESSURIZED (MISCELLANEOUS) ×3
SET MICROPUNCTURE 5F STIFF (MISCELLANEOUS) ×6 IMPLANT
SHEATH AVANTI 11CM 5FR (MISCELLANEOUS) IMPLANT
SHEATH PINNACLE 6F 10CM (SHEATH) ×3 IMPLANT
SPONGE GAUZE 2X2 STER 10/PKG (GAUZE/BANDAGES/DRESSINGS) ×2
STATION PROTECTION PRESSURIZED (MISCELLANEOUS) ×1 IMPLANT
STOPCOCK MORSE 400PSI 3WAY (MISCELLANEOUS) ×3 IMPLANT
SUT MNCRL AB 4-0 PS2 18 (SUTURE) ×3 IMPLANT
SUT PROLENE 6 0 BV (SUTURE) IMPLANT
SUT VIC AB 3-0 SH 27 (SUTURE)
SUT VIC AB 3-0 SH 27X BRD (SUTURE) IMPLANT
SYR 20CC LL (SYRINGE) ×12 IMPLANT
SYR CONTROL 10ML LL (SYRINGE) ×3 IMPLANT
TOWEL GREEN STERILE (TOWEL DISPOSABLE) ×3 IMPLANT
TUBING CIL FLEX 10 FLL-RA (TUBING) ×6 IMPLANT
UNDERPAD 30X30 (UNDERPADS AND DIAPERS) ×3 IMPLANT
WATER STERILE IRR 1000ML POUR (IV SOLUTION) IMPLANT
WIRE BENTSON .035X145CM (WIRE) ×3 IMPLANT
WIRE TORQFLEX AUST .018X40CM (WIRE) ×3 IMPLANT

## 2017-06-11 NOTE — Progress Notes (Addendum)
Dr. Roanna Banning made aware of I-Stat K+ 7.7. Last dialysis treatment was Sat 06/09/17, per pt. New orders received.

## 2017-06-11 NOTE — Op Note (Signed)
OPERATIVE NOTE   PROCEDURE: 1.  right radiocephalic arteriovenous fistula cannulation under ultrasound guidance 2.  right arm shuntogram 3.  Venoplasty of cephalic vein (6 mm x 80 mm)   PRE-OPERATIVE DIAGNOSIS: right radiocephalic arteriovenous fistula with venous stenosis   POST-OPERATIVE DIAGNOSIS: same as above   SURGEON: [name], MD  ANESTHESIA: local  ESTIMATED BLOOD LOSS: 50 cc  FINDING(S): 1.  Patent central venous structure 2.  Drainage of forearm cephalic vein via brachial venous system 3.  Proximal forearm cephalic vein with multiple >75% stenoses: resolution of stenoses in proximal half of stenotic vein segment, <30% residual stenosis in distal half 4.  Patent anastomosis 5.  Two moderate sized pseudoaneurysms  SPECIMEN(S):  None  CONTRAST: 40 cc  INDICATIONS: Kevin Walker is a 67 y.o. male who presents with right radiocephalic arteriovenous fistula with venous stenosis on physical examination.  The patient is scheduled for right arm shuntogram and possible intervention.  The patient is aware of that the risks of an angiographic procedure include but are not limited to: bleeding, infection, access site complications, thrombosis of access, renal failure, embolization, rupture of vessel, dissection, arteriovenous fistula, possible need for emergent surgical intervention, possible need for surgical procedures to treat the patient's pathology, anaphylactic reaction to contrast, and stroke and death.  The patient is aware of the risks of the procedure and elects to proceed forward.   DESCRIPTION: After full informed written consent was obtained, the patient was brought back to the angiography suite and placed supine upon the angiography table.  The patient was connected to monitoring equipment.  The right forearm was prepped and draped in the standard fashion for a percutaneous access intervention.  Under ultrasound guidance, the right radiocephalic arteriovenous  fistula was cannulated with a micropuncture needle.  The microwire was advanced into the fistula and the needle was exchanged for the a microsheath, which was lodged 2 cm into the access.  The wire was removed and the sheath was connected to the IV extension tubing.  Hand injections were completed to image the access from the cannulation site up the right atrium.  Based on the images, this patient will need: venoplasty of forearm cephalic vein.  The patient was given 3000 units of Heparin intravenously to obtain some anticoagulation.  A Bentson wire was advanced into the brachial vein and the sheath was exchanged for a 6-Fr sheath.    Based on the imaging, a 6 mm x 80 mm angioplasty balloon was selected.  The balloon was not long enough, so I inflated the balloon on the proximal segment of the forearm cephalic vein to 10 ATM for 3 minutes.  I then repositioned the balloon on the cephalic vein distal to the most proximal pseudoaneurysm.  The balloon was inflated to 10 ATM for 3 minutes.  On completion imaging, the stenosis in proximal half of the stenotic vein was resolved.  The distal half appeared to have a <30% residual stenosis.  Clinically, the pulsatile fistula was now without pulse and a easily palpable thrill was present, so I felt no further intervention was immediately needed.  Based on the completion imaging, no further intervention is necessary.  The wire and balloon were removed from the sheath.  A 4-0 Monocryl purse-string suture was sewn around the sheath.  The sheath was removed while tying down the suture.  A sterile bandage was applied to the puncture site.   COMPLICATIONS: none  CONDITION: stable   [name], MD, [facility] of [place] Office: [phone] Pager: [phone]  06/11/2017 5:05 PM

## 2017-06-11 NOTE — Transfer of Care (Signed)
Immediate Anesthesia Transfer of Care Note  Patient: Kevin Walker  Procedure(s) Performed: FISTULOGRAM RIGHT ARTERIOVENOUS FISTULA WITH INTERVENTION (Right Arm Lower)  Patient Location: PACU  Anesthesia Type:MAC  Level of Consciousness: awake, alert , oriented and patient cooperative  Airway & Oxygen Therapy: Patient Spontanous Breathing and Patient connected to face mask oxygen  Post-op Assessment: Report given to RN, Post -op Vital signs reviewed and stable and Patient moving all extremities X 4  Post vital signs: Reviewed and stable  Last Vitals:  Vitals:   06/11/17 1243  BP: (!) 166/96  Pulse: 67  Resp: 18  Temp: 36.6 C  SpO2: 100%    Last Pain:  Vitals:   06/11/17 1243  TempSrc: Oral      Patients Stated Pain Goal: 3 (94/17/40 8144)  Complications: No apparent anesthesia complications

## 2017-06-11 NOTE — Anesthesia Procedure Notes (Signed)
Procedure Name: MAC Date/Time: 06/11/2017 4:14 PM Performed by: Colin Benton, CRNA Pre-anesthesia Checklist: Patient identified, Emergency Drugs available, Suction available and Patient being monitored Patient Re-evaluated:Patient Re-evaluated prior to induction Oxygen Delivery Method: Simple face mask Induction Type: IV induction Placement Confirmation: positive ETCO2 Dental Injury: Teeth and Oropharynx as per pre-operative assessment

## 2017-06-11 NOTE — Interval H&P Note (Signed)
History and Physical Interval Note:  06/11/2017 12:44 PM  Kevin Walker  has presented today for surgery, with the diagnosis of COMPLICATION WITH FISTULA  The various methods of treatment have been discussed with the patient and family. After consideration of risks, benefits and other options for treatment, the patient has consented to  Procedure(s): FISTULOGRAM WITH POSSIBLE INTERVENTION RIGHT ARTERIOVENOUS FISTULA (Right) as a surgical intervention .  The patient's history has been reviewed, patient examined, no change in status, stable for surgery.  I have reviewed the patient's chart and labs.  Questions were answered to the patient's satisfaction.     [name]

## 2017-06-11 NOTE — Anesthesia Preprocedure Evaluation (Addendum)
Anesthesia Evaluation  Patient identified by MRN, date of birth, ID band Patient awake    Reviewed: Allergy & Precautions, NPO status , Patient's Chart, lab work & pertinent test results, reviewed documented beta blocker date and time   Airway Mallampati: III  TM Distance: >3 FB Neck ROM: Full    Dental  (+) Partial Upper, Missing,    Pulmonary sleep apnea and Continuous Positive Airway Pressure Ventilation ,    Pulmonary exam normal breath sounds clear to auscultation       Cardiovascular hypertension, Pt. on medications and Pt. on home beta blockers Normal cardiovascular exam Rhythm:Regular Rate:Normal  ECG: NSR, LAFB, rate 71   Neuro/Psych negative neurological ROS  negative psych ROS   GI/Hepatic Neg liver ROS, GERD  Medicated and Controlled,  Endo/Other  negative endocrine ROS  Renal/GU ESRF and DialysisRenal diseaseOn HD T, R, Sat     Musculoskeletal negative musculoskeletal ROS (+)   Abdominal   Peds  Hematology negative hematology ROS (+)   Anesthesia Other Findings   Reproductive/Obstetrics                            Anesthesia Physical Anesthesia Plan  ASA: IV  Anesthesia Plan: MAC   Post-op Pain Management:    Induction: Intravenous  PONV Risk Score and Plan: 1 and Treatment may vary due to age or medical condition and Propofol infusion  Airway Management Planned: Natural Airway  Additional Equipment:   Intra-op Plan:   Post-operative Plan:   Informed Consent: I have reviewed the patients History and Physical, chart, labs and discussed the procedure including the risks, benefits and alternatives for the proposed anesthesia with the patient or authorized representative who has indicated his/her understanding and acceptance.   Dental advisory given  Plan Discussed with: CRNA  Anesthesia Plan Comments:         Anesthesia Quick Evaluation

## 2017-06-12 ENCOUNTER — Encounter (HOSPITAL_COMMUNITY): Payer: Self-pay | Admitting: Vascular Surgery

## 2017-06-12 NOTE — Anesthesia Postprocedure Evaluation (Signed)
Anesthesia Post Note  Patient: Kevin Walker  Procedure(s) Performed: FISTULOGRAM RIGHT ARTERIOVENOUS FISTULA WITH INTERVENTION (Right Arm Lower)     Patient location during evaluation: PACU Anesthesia Type: MAC Level of consciousness: awake and alert Pain management: pain level controlled Vital Signs Assessment: post-procedure vital signs reviewed and stable Respiratory status: spontaneous breathing, nonlabored ventilation, respiratory function stable and patient connected to nasal cannula oxygen Cardiovascular status: stable and blood pressure returned to baseline Postop Assessment: no apparent nausea or vomiting Anesthetic complications: no    Last Vitals:  Vitals:   06/11/17 1243 06/11/17 1720  BP: (!) 166/96 134/82  Pulse: 67 62  Resp: 18 15  Temp: 36.6 C (!) 36.3 C  SpO2: 100% 97%    Last Pain:  Vitals:   06/11/17 1720  TempSrc:   PainSc: 0-No pain   Pain Goal: Patients Stated Pain Goal: 3 (06/11/17 1410)               Ryan P Ellender

## 2018-03-29 ENCOUNTER — Ambulatory Visit (INDEPENDENT_AMBULATORY_CARE_PROVIDER_SITE_OTHER): Payer: Medicare Other | Admitting: Physician Assistant

## 2018-03-29 ENCOUNTER — Other Ambulatory Visit: Payer: Self-pay

## 2018-03-29 VITALS — BP 144/84 | HR 49 | Temp 98.1°F | Resp 16 | Ht 72.0 in | Wt 200.4 lb

## 2018-03-29 DIAGNOSIS — Z992 Dependence on renal dialysis: Secondary | ICD-10-CM | POA: Diagnosis not present

## 2018-03-29 DIAGNOSIS — N186 End stage renal disease: Secondary | ICD-10-CM | POA: Diagnosis not present

## 2018-03-29 NOTE — Progress Notes (Signed)
Established Dialysis Access   History of Present Illness   Kevin Walker is a 67 y.o. (1951/01/04) male who presents for evaluation of right arm radiocephalic fistula.  Right radiocephalic fistula created about 3 years ago.  He underwent fistulogram by Dr. Bridgett Larsson earlier this year that involved venoplasty and several areas of the cephalic vein in the forearm.  The patient states 3 weeks ago a dialysis tech had difficulty cannulating his fistula which is when the referral was made to return to our office.  Since that time patient states he has not had any difficulty with cannulation or completing a full hemodialysis treatment.  Patient also has 2 pseudoaneurysmal areas however he denies any thinning skin, scabbing, bleeding or other concerning degeneration signs from these areas.  He is dialyzing under the care of Dr. Lowanda Foster in [place] on a Tuesday Thursday Saturday schedule.  Current Outpatient Medications  Medication Sig Dispense Refill  . amLODipine (NORVASC) 10 MG tablet Take 10 mg by mouth daily.    [name] atenolol (TENORMIN) 25 MG tablet Take 25 mg by mouth daily.    . B Complex-C-Folic Acid (DIALYVITE PO) Take 1 tablet by mouth daily.    . calcitRIOL (ROCALTROL) 0.25 MCG capsule Take 0.25 mcg by mouth daily.    . febuxostat (ULORIC) 40 MG tablet Take 40 mg by mouth once a week.     . ferrous sulfate 325 (65 FE) MG tablet Take 325 mg by mouth 2 (two) times daily with a meal.     . furosemide (LASIX) 80 MG tablet Take 80 mg by mouth daily.     [name] lidocaine-prilocaine (EMLA) cream Apply 1 application topically as needed (for access).    . naproxen sodium (ALEVE) 220 MG tablet Take 220 mg by mouth daily as needed (for pain or headache).    [name] omeprazole (PRILOSEC) 20 MG capsule Take 20 mg by mouth 2 (two) times daily.     . sevelamer carbonate (RENVELA) 800 MG tablet Take 800 mg by mouth 3 (three) times daily with meals.      No current facility-administered medications for this visit.       Physical Examination   Vitals:   03/29/18 1306  BP: (!) 144/84  Pulse: (!) 49  Resp: 16  Temp: 98.1 F (36.7 C)  TempSrc: Oral  SpO2: 95%  Weight: 200 lb 6.4 oz (90.9 kg)  Height: 6' (1.829 m)   Body mass index is 27.18 kg/m.  General Alert, O x 3, WD, NAD  Pulmonary Sym exp, good B air movt,   Cardiac Bradycardic,  Vascular Vessel Right Left  Radial Palpable Palpable  Brachial Palpable Palpable  Ulnar Palpable Not palpable    Musculo- skeletal  palpable thrill throughout right arm radiocephalic fistula and even beyond to pseudoaneurysmal areas; no scabbing, ulceration, bleeding, or thinning skin of 2 small to moderate sized pseudoaneurysms of fistula  Neurologic A&O; CN grossly intact     Medical Decision Making   Kevin Walker is a 68 y.o. male who presents with ESRD requiring hemodialysis.    Patent right radiocephalic fistula with palpable thrill beyond 2 areas of pseudoaneurysm  Patient denies any difficulty completing a dialysis treatment or with cannulation and believes this referral back to our office is related to an isolated incident of cannulation difficulty  No indication for fistulogram based on physical exam  Would recommend fistula duplex prior to office visits if patient has any difficulty related to fistula in the future  For now  he may follow-up on an as-needed basis   Dagoberto Ligas PA-C [facility] of [place] Office: [phone]

## 2018-05-23 DIAGNOSIS — Z79899 Other long term (current) drug therapy: Secondary | ICD-10-CM | POA: Diagnosis not present

## 2018-05-23 DIAGNOSIS — R0602 Shortness of breath: Secondary | ICD-10-CM | POA: Diagnosis not present

## 2018-05-23 DIAGNOSIS — N189 Chronic kidney disease, unspecified: Secondary | ICD-10-CM | POA: Diagnosis not present

## 2018-05-23 DIAGNOSIS — I509 Heart failure, unspecified: Secondary | ICD-10-CM | POA: Diagnosis not present

## 2018-05-23 DIAGNOSIS — I132 Hypertensive heart and chronic kidney disease with heart failure and with stage 5 chronic kidney disease, or end stage renal disease: Secondary | ICD-10-CM | POA: Diagnosis not present

## 2018-06-03 ENCOUNTER — Encounter: Payer: Non-veteran care | Admitting: Vascular Surgery

## 2018-06-10 ENCOUNTER — Encounter: Payer: Non-veteran care | Admitting: Vascular Surgery

## 2018-06-17 ENCOUNTER — Encounter: Payer: Self-pay | Admitting: Vascular Surgery

## 2018-06-17 ENCOUNTER — Other Ambulatory Visit: Payer: Self-pay | Admitting: *Deleted

## 2018-06-17 ENCOUNTER — Ambulatory Visit (INDEPENDENT_AMBULATORY_CARE_PROVIDER_SITE_OTHER): Payer: No Typology Code available for payment source | Admitting: Vascular Surgery

## 2018-06-17 ENCOUNTER — Encounter: Payer: Non-veteran care | Admitting: Vascular Surgery

## 2018-06-17 VITALS — BP 145/84 | HR 56 | Temp 98.2°F | Resp 18 | Ht 72.0 in | Wt 191.2 lb

## 2018-06-17 DIAGNOSIS — N186 End stage renal disease: Secondary | ICD-10-CM

## 2018-06-17 DIAGNOSIS — Z992 Dependence on renal dialysis: Secondary | ICD-10-CM | POA: Diagnosis not present

## 2018-06-17 NOTE — H&P (View-Only) (Signed)
Vascular and Vein Specialist of [facility] office  Patient name: Kevin Walker MRN: [id number] DOB: [birth year] Sex: male  REASON FOR VISIT: Evaluation of right forearm AV fistula  HPI: Kevin Walker is a 68 y.o. male today for evaluation of his right forearm AV fistula.  He had initially had a left arm radiocephalic fistula creation at [facility] which did not mature.  He subsequently had a right radiocephalic fistula placed in September 2016.  He has had good use of this.  He did have difficulty with stenosis and poor flow and underwent fistula angiogram in February 2019.  He had angioplasty of the fistula with good result.  He is now having difficulty with flow in his fistula and is seen for further discussion.  He reports that this is somewhat technician dependent regarding access.  Past Medical History:  Diagnosis Date  . Anemia    low iron  . Chronic kidney disease    Dialysis T/Th/Sa  . GERD (gastroesophageal reflux disease)   . Heart murmur   . History of kidney stones   . Hypertension   . Sleep apnea    has a cpap - has not used it in past, but starting soon    Family History  Problem Relation Age of Onset  . Diabetes Mother   . Heart disease Mother   . Heart disease Father     SOCIAL HISTORY: Social History   Tobacco Use  . Smoking status: Never Smoker  . Smokeless tobacco: Never Used  Substance Use Topics  . Alcohol use: No    Frequency: Never    Comment: years ago, none now    No Known Allergies  Current Outpatient Medications  Medication Sig Dispense Refill  . amLODipine (NORVASC) 10 MG tablet Take 10 mg by mouth daily.    [name] atenolol (TENORMIN) 25 MG tablet Take 25 mg by mouth daily.    . B Complex-C-Folic Acid (DIALYVITE PO) Take 1 tablet by mouth daily.    . calcitRIOL (ROCALTROL) 0.25 MCG capsule Take 0.25 mcg by mouth daily.    . febuxostat (ULORIC) 40 MG tablet Take 40 mg by mouth once a  week.     . ferrous sulfate 325 (65 FE) MG tablet Take 325 mg by mouth 2 (two) times daily with a meal.     . furosemide (LASIX) 80 MG tablet Take 80 mg by mouth daily.     [name] lidocaine-prilocaine (EMLA) cream Apply 1 application topically as needed (for access).    . naproxen sodium (ALEVE) 220 MG tablet Take 220 mg by mouth daily as needed (for pain or headache).    [name] omeprazole (PRILOSEC) 20 MG capsule Take 20 mg by mouth 2 (two) times daily.     . sevelamer carbonate (RENVELA) 800 MG tablet Take 800 mg by mouth 3 (three) times daily with meals.      No current facility-administered medications for this visit.     REVIEW OF SYSTEMS:  [X]  denotes positive finding, [ ]  denotes negative finding Cardiac  Comments:  Chest pain or chest pressure:    Shortness of breath upon exertion: x   Short of breath when lying flat:    Irregular heart rhythm:        Vascular    Pain in calf, thigh, or hip brought on by ambulation:    Pain in feet at night that wakes you up from your sleep:     Blood clot in your veins:  Leg swelling:           PHYSICAL EXAM: Vitals:   06/17/18 0958  BP: (!) 145/84  Pulse: (!) 56  Resp: 18  Temp: 98.2 F (36.8 C)  TempSrc: Temporal  Weight: 191 lb 3.2 oz (86.7 kg)  Height: 6' (1.829 m)    GENERAL: The patient is a well-nourished male, in no acute distress. The vital signs are documented above. CARDIOVASCULAR: Radial pulses bilaterally.  He does have good flow through his right forearm radiocephalic fistula.  There are areas of aneurysmal degeneration but no evidence of skin breakdown where he is having access.  It does feel rather pulsatile in the area above the radial anastomosis up into the access site.  The sound with stethoscope is not as good a thrill as it is above this area PULMONARY: There is good air exchange  MUSCULOSKELETAL: There are no major deformities or cyanosis. NEUROLOGIC: No focal weakness or paresthesias are detected. SKIN: There are  no ulcers or rashes noted. PSYCHIATRIC: The patient has a normal affect.  DATA:  None  MEDICAL ISSUES: Discussed this finding with the patient.  Suspect he has had recurrent stenosis.  Explained that fistula is in all probability require ongoing maintenance and that he has had good use of his fistula to this date.  Have recommended a repeat angiography of his fistula to determine if correctable issues are present.  He currently undergoes hemodialysis on Tuesday, Thursday and Saturday.  We will coordinate this on a nondialysis day at [facility]    Rosetta Posner, MD [facility] V[facility] and Vein Specialists of [facility] Tel [phone] Pager [phone]

## 2018-06-17 NOTE — Progress Notes (Signed)
Vascular and Vein Specialist of [facility] office  Patient name: Kevin Walker MRN: [id number] DOB: 1950-07-06 Sex: male  REASON FOR VISIT: Evaluation of right forearm AV fistula  HPI: Kevin Walker is a 68 y.o. male today for evaluation of his right forearm AV fistula.  He had initially had a left arm radiocephalic fistula creation at [facility] which did not mature.  He subsequently had a right radiocephalic fistula placed in September 2016.  He has had good use of this.  He did have difficulty with stenosis and poor flow and underwent fistula angiogram in February 2019.  He had angioplasty of the fistula with good result.  He is now having difficulty with flow in his fistula and is seen for further discussion.  He reports that this is somewhat technician dependent regarding access.  Past Medical History:  Diagnosis Date  . Anemia    low iron  . Chronic kidney disease    Dialysis T/Th/Sa  . GERD (gastroesophageal reflux disease)   . Heart murmur   . History of kidney stones   . Hypertension   . Sleep apnea    has a cpap - has not used it in past, but starting soon    Family History  Problem Relation Age of Onset  . Diabetes Mother   . Heart disease Mother   . Heart disease Father     SOCIAL HISTORY: Social History   Tobacco Use  . Smoking status: Never Smoker  . Smokeless tobacco: Never Used  Substance Use Topics  . Alcohol use: No    Frequency: Never    Comment: years ago, none now    No Known Allergies  Current Outpatient Medications  Medication Sig Dispense Refill  . amLODipine (NORVASC) 10 MG tablet Take 10 mg by mouth daily.    [name] atenolol (TENORMIN) 25 MG tablet Take 25 mg by mouth daily.    . B Complex-C-Folic Acid (DIALYVITE PO) Take 1 tablet by mouth daily.    . calcitRIOL (ROCALTROL) 0.25 MCG capsule Take 0.25 mcg by mouth daily.    . febuxostat (ULORIC) 40 MG tablet Take 40 mg by mouth once a  week.     . ferrous sulfate 325 (65 FE) MG tablet Take 325 mg by mouth 2 (two) times daily with a meal.     . furosemide (LASIX) 80 MG tablet Take 80 mg by mouth daily.     [name] lidocaine-prilocaine (EMLA) cream Apply 1 application topically as needed (for access).    . naproxen sodium (ALEVE) 220 MG tablet Take 220 mg by mouth daily as needed (for pain or headache).    [name] omeprazole (PRILOSEC) 20 MG capsule Take 20 mg by mouth 2 (two) times daily.     . sevelamer carbonate (RENVELA) 800 MG tablet Take 800 mg by mouth 3 (three) times daily with meals.      No current facility-administered medications for this visit.     REVIEW OF SYSTEMS:  [X]  denotes positive finding, [ ]  denotes negative finding Cardiac  Comments:  Chest pain or chest pressure:    Shortness of breath upon exertion: x   Short of breath when lying flat:    Irregular heart rhythm:        Vascular    Pain in calf, thigh, or hip brought on by ambulation:    Pain in feet at night that wakes you up from your sleep:     Blood clot in your veins:  Leg swelling:           PHYSICAL EXAM: Vitals:   06/17/18 0958  BP: (!) 145/84  Pulse: (!) 56  Resp: 18  Temp: 98.2 F (36.8 C)  TempSrc: Temporal  Weight: 191 lb 3.2 oz (86.7 kg)  Height: 6' (1.829 m)    GENERAL: The patient is a well-nourished male, in no acute distress. The vital signs are documented above. CARDIOVASCULAR: Radial pulses bilaterally.  He does have good flow through his right forearm radiocephalic fistula.  There are areas of aneurysmal degeneration but no evidence of skin breakdown where he is having access.  It does feel rather pulsatile in the area above the radial anastomosis up into the access site.  The sound with stethoscope is not as good a thrill as it is above this area PULMONARY: There is good air exchange  MUSCULOSKELETAL: There are no major deformities or cyanosis. NEUROLOGIC: No focal weakness or paresthesias are detected. SKIN: There are  no ulcers or rashes noted. PSYCHIATRIC: The patient has a normal affect.  DATA:  None  MEDICAL ISSUES: Discussed this finding with the patient.  Suspect he has had recurrent stenosis.  Explained that fistula is in all probability require ongoing maintenance and that he has had good use of his fistula to this date.  Have recommended a repeat angiography of his fistula to determine if correctable issues are present.  He currently undergoes hemodialysis on Tuesday, Thursday and Saturday.  We will coordinate this on a nondialysis day at [facility]    Rosetta Posner, MD [facility] Vascular and Vein Specialists of [facility] Tel [phone] Pager [phone]

## 2018-06-17 NOTE — Progress Notes (Signed)
Call to patient to schedule procedure. To be at [facility] admitting at 8:30 am on 06/28/2018(patient choice). NPO past MN night prior and must have a driver and caregiver for discharge to home. Take Amlodipine, Atenolol, Lasix am of procedure and hold all others till after. Verbalized understanding.

## 2018-06-28 ENCOUNTER — Ambulatory Visit (HOSPITAL_COMMUNITY): Payer: No Typology Code available for payment source

## 2018-06-28 ENCOUNTER — Other Ambulatory Visit: Payer: Self-pay

## 2018-06-28 ENCOUNTER — Ambulatory Visit (HOSPITAL_COMMUNITY)
Admission: RE | Admit: 2018-06-28 | Discharge: 2018-06-28 | Disposition: A | Discharge status: Home / Self Care | Payer: No Typology Code available for payment source | Attending: Vascular Surgery | Admitting: Vascular Surgery

## 2018-06-28 ENCOUNTER — Encounter (HOSPITAL_COMMUNITY)
Admission: RE | Disposition: A | Discharge status: Home / Self Care | Payer: Self-pay | Source: Home / Self Care | Attending: Vascular Surgery

## 2018-06-28 DIAGNOSIS — Z8249 Family history of ischemic heart disease and other diseases of the circulatory system: Secondary | ICD-10-CM | POA: Diagnosis not present

## 2018-06-28 DIAGNOSIS — D649 Anemia, unspecified: Secondary | ICD-10-CM | POA: Diagnosis not present

## 2018-06-28 DIAGNOSIS — Z992 Dependence on renal dialysis: Secondary | ICD-10-CM | POA: Diagnosis not present

## 2018-06-28 DIAGNOSIS — Z452 Encounter for adjustment and management of vascular access device: Secondary | ICD-10-CM

## 2018-06-28 DIAGNOSIS — I120 Hypertensive chronic kidney disease with stage 5 chronic kidney disease or end stage renal disease: Secondary | ICD-10-CM | POA: Insufficient documentation

## 2018-06-28 DIAGNOSIS — J90 Pleural effusion, not elsewhere classified: Secondary | ICD-10-CM | POA: Diagnosis not present

## 2018-06-28 DIAGNOSIS — Z79899 Other long term (current) drug therapy: Secondary | ICD-10-CM | POA: Insufficient documentation

## 2018-06-28 DIAGNOSIS — K219 Gastro-esophageal reflux disease without esophagitis: Secondary | ICD-10-CM | POA: Diagnosis not present

## 2018-06-28 DIAGNOSIS — N186 End stage renal disease: Secondary | ICD-10-CM | POA: Insufficient documentation

## 2018-06-28 DIAGNOSIS — G4730 Sleep apnea, unspecified: Secondary | ICD-10-CM | POA: Diagnosis not present

## 2018-06-28 DIAGNOSIS — N185 Chronic kidney disease, stage 5: Secondary | ICD-10-CM

## 2018-06-28 HISTORY — PX: A/V FISTULAGRAM: CATH118298

## 2018-06-28 HISTORY — PX: TEMPORARY DIALYSIS CATHETER: CATH118312

## 2018-06-28 LAB — POCT I-STAT 4, (NA,K, GLUC, HGB,HCT)
Glucose, Bld: 92 mg/dL (ref 70–99)
HCT: 37 % — ABNORMAL LOW (ref 39.0–52.0)
Hemoglobin: 12.6 g/dL — ABNORMAL LOW (ref 13.0–17.0)
Potassium: 5 mmol/L (ref 3.5–5.1)
Sodium: 135 mmol/L (ref 135–145)

## 2018-06-28 LAB — POCT I-STAT CREATININE: Creatinine, Ser: 6.6 mg/dL — ABNORMAL HIGH (ref 0.61–1.24)

## 2018-06-28 SURGERY — A/V FISTULAGRAM
Anesthesia: LOCAL

## 2018-06-28 MED ORDER — SODIUM CHLORIDE 0.9% FLUSH: 3.0000 mL | Freq: Two times a day (BID) | INTRAVENOUS | Status: DC

## 2018-06-28 MED ORDER — FENTANYL CITRATE (PF) 100 MCG/2ML IJ SOLN
INTRAMUSCULAR | Status: AC
  Filled 2018-06-28: qty 2

## 2018-06-28 MED ORDER — HEPARIN (PORCINE) IN NACL 1000-0.9 UT/500ML-% IV SOLN
INTRAVENOUS | Status: DC | PRN
  Administered 2018-06-28: 500 mL

## 2018-06-28 MED ORDER — SODIUM CHLORIDE 0.9 % IV SOLN: 250.0000 mL | INTRAVENOUS | Status: DC | PRN

## 2018-06-28 MED ORDER — LIDOCAINE HCL (PF) 1 % IJ SOLN
INTRAMUSCULAR | Status: DC | PRN
  Administered 2018-06-28: 5 mL
  Administered 2018-06-28: 25 mL

## 2018-06-28 MED ORDER — IODIXANOL 320 MG/ML IV SOLN
INTRAVENOUS | Status: DC | PRN
  Administered 2018-06-28: 60 mL via INTRAVENOUS

## 2018-06-28 MED ORDER — ONDANSETRON HCL 4 MG/2ML IJ SOLN: 4.0000 mg | Freq: Four times a day (QID) | INTRAMUSCULAR | Status: DC | PRN

## 2018-06-28 MED ORDER — SODIUM CHLORIDE 0.9% FLUSH: 3.0000 mL | INTRAVENOUS | Status: DC | PRN

## 2018-06-28 MED ORDER — HYDRALAZINE HCL 20 MG/ML IJ SOLN: 5.0000 mg | INTRAMUSCULAR | Status: DC | PRN

## 2018-06-28 MED ORDER — LIDOCAINE HCL (PF) 1 % IJ SOLN
INTRAMUSCULAR | Status: AC
  Filled 2018-06-28: qty 30

## 2018-06-28 MED ORDER — MIDAZOLAM HCL 2 MG/2ML IJ SOLN
INTRAMUSCULAR | Status: AC
  Filled 2018-06-28: qty 2

## 2018-06-28 MED ORDER — MORPHINE SULFATE (PF) 10 MG/ML IV SOLN: 2.0000 mg | INTRAVENOUS | Status: DC | PRN

## 2018-06-28 MED ORDER — ACETAMINOPHEN 325 MG PO TABS: 650.0000 mg | ORAL_TABLET | ORAL | Status: DC | PRN

## 2018-06-28 MED ORDER — OXYCODONE HCL 5 MG PO TABS: 5.0000 mg | ORAL_TABLET | ORAL | Status: DC | PRN

## 2018-06-28 MED ORDER — FENTANYL CITRATE (PF) 100 MCG/2ML IJ SOLN
INTRAMUSCULAR | Status: DC | PRN
  Administered 2018-06-28 (×2): 50 ug via INTRAVENOUS

## 2018-06-28 MED ORDER — HEPARIN (PORCINE) IN NACL 1000-0.9 UT/500ML-% IV SOLN
INTRAVENOUS | Status: AC
  Filled 2018-06-28: qty 500

## 2018-06-28 MED ORDER — LABETALOL HCL 5 MG/ML IV SOLN: 10.0000 mg | INTRAVENOUS | Status: DC | PRN

## 2018-06-28 MED ORDER — MIDAZOLAM HCL 2 MG/2ML IJ SOLN
INTRAMUSCULAR | Status: DC | PRN
  Administered 2018-06-28: 2 mg via INTRAVENOUS

## 2018-06-28 SURGICAL SUPPLY — 14 items
BAG SNAP BAND KOVER 36X36 (MISCELLANEOUS) ×3 IMPLANT
CATH PALINDROME RT-P 15FX23CM (CATHETERS) ×3 IMPLANT
COVER DOME SNAP 22 D (MISCELLANEOUS) ×3 IMPLANT
DERMABOND ADVANCED (GAUZE/BANDAGES/DRESSINGS) ×1
DERMABOND ADVANCED .7 DNX12 (GAUZE/BANDAGES/DRESSINGS) ×2 IMPLANT
GLIDEWIRE NITREX 0.018X80X5 (WIRE) ×1
GUIDEWIRE NITREX 0.018X80X5 (WIRE) ×2 IMPLANT
KIT MICROPUNCTURE NIT STIFF (SHEATH) ×3 IMPLANT
PROTECTION STATION PRESSURIZED (MISCELLANEOUS) ×3
SHEATH PROBE COVER 6X72 (BAG) ×6 IMPLANT
STATION PROTECTION PRESSURIZED (MISCELLANEOUS) ×2 IMPLANT
STOPCOCK MORSE 400PSI 3WAY (MISCELLANEOUS) ×3 IMPLANT
TRAY PV CATH (CUSTOM PROCEDURE TRAY) ×6 IMPLANT
TUBING CIL FLEX 10 FLL-RA (TUBING) ×3 IMPLANT

## 2018-06-28 NOTE — Discharge Instructions (Signed)
Venogram, Care After This sheet gives you information about how to care for yourself after your procedure. Your health care provider may also give you more specific instructions. If you have problems or questions, contact your health care provider. What can I expect after the procedure? After the procedure, it is common to have:  Bruising or mild discomfort in the area where the IV was inserted (insertion site). Follow these instructions at home: Eating and drinking   Follow instructions from your health care provider about eating or drinking restrictions.  Drink a lot of fluids for the first several days after the procedure, as directed by your health care provider. This helps to wash (flush) the contrast out of your body. Examples of healthy fluids include water or low-calorie drinks. General instructions  Check your IV insertion area every day for signs of infection. Check for: ? Redness, swelling, or pain. ? Fluid or blood. ? Warmth. ? Pus or a bad smell.  Take over-the-counter and prescription medicines only as told by your health care provider.  Rest and return to your normal activities as told by your health care provider. Ask your health care provider what activities are safe for you.  Do not drive for 24 hours if you were given a medicine to help you relax (sedative), or until your health care provider approves.  Keep all follow-up visits as told by your health care provider. This is important. Contact a health care provider if:  Your skin becomes itchy or you develop a rash or hives.  You have a fever that does not get better with medicine.  You feel nauseous.  You vomit.  You have redness, swelling, or pain around the insertion site.  You have fluid or blood coming from the insertion site.  Your insertion area feels warm to the touch.  You have pus or a bad smell coming from the insertion site. Get help right away if:  You have difficulty breathing or  shortness of breath.  You develop chest pain.  You faint.  You feel very dizzy. These symptoms may represent a serious problem that is an emergency. Do not wait to see if the symptoms will go away. Get medical help right away. Call your local emergency services (911 in the [place] Do not drive yourself to the hospital. Summary  After your procedure, it is common to have bruising or mild discomfort in the area where the IV was inserted.  You should check your IV insertion area every day for signs of infection.  Take over-the-counter and prescription medicines only as told by your health care provider.  You should drink a lot of fluids for the first several days after the procedure to help flush the contrast from your body. This information is not intended to replace advice given to you by your health care provider. Make sure you discuss any questions you have with your health care provider. Document Released: 02/12/2013 Document Revised: 03/18/2016 Document Reviewed: 03/18/2016 Elsevier Interactive Patient Education  2019 [place] Catheter Insertion, Care After Refer to this sheet in the next few weeks. These instructions provide you with information about caring for yourself after your procedure. Your health care provider may also give you more specific instructions. Your treatment has been planned according to current medical practices, but problems sometimes occur. Call your health care provider if you have any problems or questions after your procedure. What can I expect after the procedure? After the procedure, it is common to have:  Some mild redness, swelling, and pain around your catheter site.  A small amount of blood or clear fluid coming from your incisions. Follow these instructions at home: Incision care  Check your incision areas every day for signs of infection. Check for: ? More redness, swelling, or pain. ? More fluid or blood. ? Warmth. ? Pus or a bad  smell.  Follow instructions from your health care provider about how to take care of your incisions. Make sure you: ? Wash your hands with soap and water before you change your bandages (dressings). If soap and water are not available, use hand sanitizer. ? Change your dressings as told by your health care provider. Wash the area around your incisions with a germ-killing (antiseptic) solution when you change your dressing, as told by your health care provider. ? Leave stitches (sutures), skin glue, or adhesive strips in place. These skin closures may need to stay in place for 2 weeks or longer. If adhesive strip edges start to loosen and curl up, you may trim the loose edges. Do not remove adhesive strips completely unless your health care provider tells you to do that. Catheter Care   Wash your hands with soap and water before and after caring for your catheter. If soap and water are not available, use hand sanitizer.  Keep your catheter site and your dressings clean and dry.  Apply an antibiotic ointment to your catheter site as told by your health care provider.  Flush your catheter as told by your health care provider. This helps prevent it from becoming clogged.  Do not open the caps on the ends of the catheter.  Do not pull on your catheter.  If your catheter is in your arm: ? Avoid wearing tight clothes or tight jewelry on your arm that has the catheter. ? Do not sleep with your head on the arm that has the catheter. ? Do not allow your blood pressure to be taken on the arm that has the catheter. ? Do not allow your blood to be drawn from the arm that has the catheter, except through the catheter itself. Medicines  Take over-the-counter and prescription medicines only as told by your health care provider.  If you were prescribed an antibiotic medicine, take it as told by your health care provider. Do not stop taking the antibiotic even if you start to feel  better. Activity  Return to your normal activities as told by your health care provider. Ask your health care provider what activities are safe for you.  Do not lift anything that is heavier than 10 lb (4.5 kg) for 3 weeks or as long as told by your health care provider. Driving  Do not drive until your health care provider approves.  Do not drive or operate heavy machinery while taking prescription pain medicine. General instructions  Follow your health care provider's specific instructions for the type of catheter that you have.  Do not take baths, swim, or use a hot tub until your health care provider approves.  Follow instructions from your health care provider about eating or drinking restrictions.  Wear compression stockings as told by your health care provider. These stockings help to prevent blood clots and reduce swelling in your legs.  Keep all follow-up visits as told by your health care provider. This is important. Contact a health care provider if:  You have more fluid or blood coming from your incisions.  You have more redness, swelling, or pain at your incisions  or around the area where your catheter is inserted.  Your incisions feel warm to the touch.  You feel unusually weak.  You feel nauseous.  Your catheter is not working properly.  You have blood or fluid draining from your catheter.  You are unable to flush your catheter. Get help right away if:  Your catheter breaks.  A hole develops in your catheter.  Your catheter comes loose or gets pulled completely out. If this happens, press on your catheter site firmly with your hand or a clean cloth until you get medical help.  Your catheter becomes blocked.  You have swelling in your arm, shoulder, neck, or face.  You develop chest pain.  You have difficulty breathing.  You feel dizzy or light-headed.  You have pus or a bad smell coming from your incisions.  You have a fever.  You develop  bleeding from your catheter or your insertion site, and your bleeding does not stop. This information is not intended to replace advice given to you by your health care provider. Make sure you discuss any questions you have with your health care provider. Document Released: 04/10/2012 Document Revised: 12/26/2015 Document Reviewed: 01/18/2015 Elsevier Interactive Patient Education  [employer].

## 2018-06-28 NOTE — Op Note (Signed)
Procedure:  1.  Right arm fistulogram  2.  Insertion of right internal jugular vein palindrome catheter  Preoperative diagnosis: End-stage renal disease  Postoperative diagnosis: Same  Anesthesia: Local with IV sedation  Operative findings:  1.  Diffuse narrowing outflow vein right radiocephalic AV fistula extending over about 5 cm length.  Also aneurysmal degeneration.  Fistula not salvageable  2.  23 cm palindrome catheter right internal jugular vein  Operative details: After obtaining form consent, the patient was taken the [facility] lab.  The patient placed supine position the angios table.  Patient's right upper extremities prepped and draped in usual sterile fashion.  Ultrasound was used to identify the patient's radiocephalic fistula.  I tried several attempts to access the fistula near the wrist.  These were unsuccessful.  I then moved up to the mid forearm and was able to successfully cannulate the fistula in this location.  Micropuncture sheath was placed into the fistula.  Contrast angiogram was then performed.  This shows the central veins including the superior vena cava innominate vein and right subclavian vein are all widely patent.  The axillary vein is patent.  The fistula outflow is very sluggish with considerable time taken to wash out all the contrast consistent with poor flow.  We then placed a blood pressure cuff on the upper arm and inflated this to reflux contrast across the arterial anastomosis.  Again the contrast was very sluggish but you could see a about a 5 to 7 cm segment of diffusely narrowed vein that was almost totally occlusive with a micropuncture sheath.  I attempted to access the fistula to try to get a wire across this but due to the aneurysmal degeneration the wire would not advance easily.  At this point I decided that most likely the fistula was not salvageable with balloon angioplasty certainly not in of any durability.  The catheter was removed from the fistula.   Hemostasis was obtained with direct pressure at the needle puncture sites.  Patient's entire neck and chest were then prepped and draped in usual sterile fashion.  Ultrasound was used to localize the right internal jugular vein.  An introducer needle was used to cannulate the right internal jugular vein after local infiltration.  An 035 J-wire was then threaded down into the right atrium under fluoroscopic guidance.  Sequential 1214 and 16 French dilators with peel-away sheath placed into the right internal jugular vein.  23 cm palindrome catheter was then advanced through the peel-away sheath and the peel-away sheath removed.  Catheter tip was confirmed to be in the right atrium.  Catheter was then tunneled subcutaneously to the right infraclavicular incision through a separate stab.  The catheter was sutured to the skin with nylon sutures.  The neck insertion site was closed with a Monocryl stitch.  The catheter was noted to flush and draw easily.  The catheter was then loaded with concentrated heparin solution.  Patient tolerated the procedure well and there were no complications.  The patient was taken to the holding area in stable condition.  Operative management: The patient will be scheduled in the near future for an outpatient office visit to re-vein map both arms and plan a new permanent access.  Ruta Hinds, MD [facility] [place] Office: [phone] Pager: [phone]

## 2018-06-28 NOTE — Interval H&P Note (Signed)
History and Physical Interval Note:  06/28/2018 10:44 AM  Kevin Walker  has presented today for surgery, with the diagnosis of pvd  The various methods of treatment have been discussed with the patient and family. After consideration of risks, benefits and other options for treatment, the patient has consented to  Procedure(s): A/V FISTULAGRAM (N/A) as a surgical intervention .  The patient's history has been reviewed, patient examined, no change in status, stable for surgery.  I have reviewed the patient's chart and labs.  Questions were answered to the patient's satisfaction.     Ruta Hinds

## 2018-07-01 ENCOUNTER — Encounter (HOSPITAL_COMMUNITY): Payer: Self-pay | Admitting: Vascular Surgery

## 2018-07-01 MED FILL — Lidocaine HCl Local Preservative Free (PF) Inj 1%: INTRAMUSCULAR | Qty: 30 | Status: AC

## 2018-07-02 ENCOUNTER — Encounter (HOSPITAL_COMMUNITY): Payer: Self-pay | Admitting: Vascular Surgery

## 2018-07-03 ENCOUNTER — Telehealth: Payer: Self-pay | Admitting: Vascular Surgery

## 2018-07-03 NOTE — Telephone Encounter (Signed)
-----   Message from Pennelope Bracken sent at 07/03/2018  9:27 AM EST -----   ----- Message ----- From: Elam Dutch, MD Sent: 06/28/2018   1:17 PM EST To: [facility] Admin Pool, [facility] Charge Pool    Procedure:  1.  Right arm fistulogram  2.  Insertion of right internal jugular vein palindrome catheter  [place] arm and neck   The patient need to be be scheduled in the next 1-2 weeks for an outpatient office visit to re-vein map both arms and plan a new permanent access.  He can see any physician or pa clinic  Ruta Hinds, MD [facility] [place] Office: [phone] Pager: [phone]

## 2018-07-03 NOTE — Telephone Encounter (Signed)
ssch appt spk to pt mld ltr 07/17/2018 3pm UE vein mapping 4pm f/u MD

## 2018-07-11 ENCOUNTER — Other Ambulatory Visit: Payer: Self-pay

## 2018-07-11 DIAGNOSIS — N186 End stage renal disease: Secondary | ICD-10-CM

## 2018-07-11 DIAGNOSIS — Z992 Dependence on renal dialysis: Principal | ICD-10-CM

## 2018-07-17 ENCOUNTER — Ambulatory Visit (INDEPENDENT_AMBULATORY_CARE_PROVIDER_SITE_OTHER): Payer: No Typology Code available for payment source | Admitting: Physician Assistant

## 2018-07-17 ENCOUNTER — Ambulatory Visit (HOSPITAL_COMMUNITY)
Admission: RE | Admit: 2018-07-17 | Discharge: 2018-07-17 | Disposition: A | Discharge status: Home / Self Care | Payer: No Typology Code available for payment source | Source: Ambulatory Visit | Attending: Vascular Surgery | Admitting: Vascular Surgery

## 2018-07-17 ENCOUNTER — Other Ambulatory Visit: Payer: Self-pay

## 2018-07-17 VITALS — BP 139/82 | HR 54 | Resp 18 | Ht 72.0 in | Wt 192.3 lb

## 2018-07-17 DIAGNOSIS — Z992 Dependence on renal dialysis: Secondary | ICD-10-CM | POA: Insufficient documentation

## 2018-07-17 DIAGNOSIS — N186 End stage renal disease: Secondary | ICD-10-CM

## 2018-07-17 NOTE — Progress Notes (Signed)
[facility] [place] HISTORY AND PHYSICAL   History of Present Illness:  Patient is a 68 y.o. year old male who presents for placement of a permanent hemodialysis access. Kevin Walker is a 68 y.o. male today for evaluation of his right forearm AV fistula.  He had initially had a left arm radiocephalic fistula creation at [facility] which did not mature.  He subsequently had a right radiocephalic fistula placed in September 2016.  He has had good use of this.  He did have difficulty with stenosis and poor flow and underwent fistula angiogram in February 2019.  He had angioplasty of the fistula with good result.  He then was scheduled for a repeat fistulaogram with the following results:  Diffuse narrowing outflow vein right radiocephalic AV fistula extending over about 5 cm length.  Also aneurysmal degeneration.  Fistula not salvageable.     He is here today for follow up and new vein mapping to discuss new access placement.  He has a right [facility] for HD currently.    Past Medical History:  Diagnosis Date  . Anemia    low iron  . Chronic kidney disease    Dialysis T/Th/Sa  . GERD (gastroesophageal reflux disease)   . Heart murmur   . History of kidney stones   . Hypertension   . Sleep apnea    has a cpap - has not used it in past, but starting soon    Past Surgical History:  Procedure Laterality Date  . A/V FISTULAGRAM N/A 06/28/2018   Procedure: A/V FISTULAGRAM;  Surgeon: Elam Dutch, MD;  Location: [place] CV [facility];  Service: Cardiovascular;  Laterality: N/A;  . AV FISTULA PLACEMENT Right   . AV FISTULA PLACEMENT Left    this one never used  . COLONOSCOPY    . FISTULOGRAM Right 06/11/2017   Procedure: FISTULOGRAM RIGHT ARTERIOVENOUS FISTULA WITH INTERVENTION;  Surgeon: Conrad Manuel Garcia, MD;  Location: [facility];  Service: Vascular;  Laterality: Right;  . INSERTION OF DIALYSIS CATHETER    . IR GENERIC HISTORICAL  12/02/2015   IR FLUORO GUIDE CV LINE LEFT  12/02/2015 Jacqulynn Cadet, MD [facility]-INTER[facility] RAD  . IR GENERIC HISTORICAL  03/06/2016   IR REMOVAL TUN CV CATH W/O FL 03/06/2016 Markus Daft, MD [facility]-INTERV RAD  . TEMPORARY DIALYSIS CATHETER  06/28/2018   Procedure: TEMPORARY DIALYSIS CATHETER;  Surgeon: Elam Dutch, MD;  Location: [place] CV [facility];  Service: Cardiovascular;;  Right Subclavian Dialysis catheter insertion     Social History Social History   Tobacco Use  . Smoking status: Never Smoker  . Smokeless tobacco: Never Used  Substance Use Topics  . Alcohol use: No    Frequency: Never    Comment: years ago, none now  . Drug use: No    Comment: years ago    Family History Family History  Problem Relation Age of Onset  . Diabetes Mother   . Heart disease Mother   . Heart disease Father     Allergies  No Known Allergies   Current Outpatient Medications  Medication Sig Dispense Refill  . acetaminophen (TYLENOL) 500 MG tablet Take 1,000 mg by mouth every 6 (six) hours as needed for moderate pain.    [name] amLODipine (NORVASC) 10 MG tablet Take 10 mg by mouth daily.    [name] atenolol (TENORMIN) 50 MG tablet Take 50 mg by mouth daily.    . B Complex-C-Folic Acid (DIALYVITE PO) Take 1 tablet by mouth daily.    [name]  bismuth subsalicylate (PEPTO BISMOL) 262 MG/15ML suspension Take 30 mLs by mouth every 6 (six) hours as needed for indigestion.    . calcitRIOL (ROCALTROL) 0.25 MCG capsule Take 0.25 mcg by mouth daily.    . febuxostat (ULORIC) 40 MG tablet Take 20 mg by mouth daily as needed (gout flare).     . ferrous sulfate 325 (65 FE) MG tablet Take 325 mg by mouth daily.     . furosemide (LASIX) 80 MG tablet Take 80 mg by mouth daily.     [name] HYDROcodone-acetaminophen (NORCO/VICODIN) 5-325 MG tablet Take 1 tablet by mouth 2 (two) times daily as needed for moderate pain.    [name] lidocaine-prilocaine (EMLA) cream Apply 1 application topically as needed (for access).    [name] omeprazole (PRILOSEC) 20 MG capsule Take 20 mg by mouth 2 (two)  times daily.     . sevelamer carbonate (RENVELA) 800 MG tablet Take 800 mg by mouth 3 (three) times daily with meals.     . traZODone (DESYREL) 100 MG tablet Take 100 mg by mouth at bedtime as needed for sleep.     No current facility-administered medications for this visit.     ROS:   General:  No weight loss, Fever, chills  HEENT: No recent headaches, no nasal bleeding, no visual changes, no sore throat  Neurologic: No dizziness, blackouts, seizures. No recent symptoms of stroke or mini- stroke. No recent episodes of slurred speech, or temporary blindness.  Cardiac: No recent episodes of chest pain/pressure, no shortness of breath at rest.  No shortness of breath with exertion.  Denies history of atrial fibrillation or irregular heartbeat  Vascular: No history of rest pain in feet.  No history of claudication.  No history of non-healing ulcer, No history of DVT   Pulmonary: No home oxygen, no productive cough, no hemoptysis,  No asthma or wheezing  Musculoskeletal:  [ ]  Arthritis, [ ]  Low back pain,  [ ]  Joint pain  Hematologic:No history of hypercoagulable state.  No history of easy bleeding.  No history of anemia  Gastrointestinal: No hematochezia or melena,  No gastroesophageal reflux, no trouble swallowing  Urinary: [ ]  chronic Kidney disease, [x ] on HD - [ ]  MWF or [x ] TTHS, [ ]  Burning with urination, [ ]  Frequent urination, [ ]  Difficulty urinating;   Skin: No rashes  Psychological: No history of anxiety,  No history of depression   Physical Examination  Vitals:   07/17/18 1523  BP: 139/82  Pulse: (!) 54  Resp: 18  SpO2: 94%  Weight: 192 lb 4.8 oz (87.2 kg)  Height: 6' (1.829 m)    Body mass index is 26.08 kg/m.  General:  Alert and oriented, no acute distress HEENT: Normal Neck: No bruit or JVD Pulmonary: Clear to auscultation bilaterally Cardiac: Regular Rate and Rhythm without murmur Gastrointestinal: Soft, non-tender, non-distended, no mass, no  scars Skin: No rash Extremity Pulses:  2+ radial, brachial pulses bilaterally, no thrill in right forearm fistula. Musculoskeletal: No deformity or edema  Neurologic: Upper and lower extremity motor 5/5 and symmetric  DATA:  His vein mapping revealed no useable veins in B UE all < 0.25 cm    ASSESSMENT:  ESRD with 2 failed forearm fistulas.  The right fistula lasted about 3 years.      PLAN: I set him up for left UE AV graft placement in the near future.  He is not on anticoagulation.  He is right hand dominant and has a  TDC for HD TTS currently.  Patient agrees with this plan.     Roxy Horseman PA-C [facility] [place] Office: [phone]  MD in clinic [place]

## 2018-08-12 ENCOUNTER — Other Ambulatory Visit: Payer: Self-pay | Admitting: *Deleted

## 2018-08-12 NOTE — Progress Notes (Signed)
Call to patient. Procedure scheduled for 08/26/2018. Instructed to be at [facility] admitting at 8:30 am. NPO past MN night prior and must have a driver and caregiver for discharge. Expect a call and follow the detailed instructions received from the hospital pre-admission department. Visitor restrictions discussed. Verbalized understanding.

## 2018-08-22 ENCOUNTER — Telehealth: Payer: Self-pay | Admitting: *Deleted

## 2018-08-22 NOTE — Telephone Encounter (Signed)
Date change of procedure agreed upon. To be at admitting at [facility] at 8:30 am on 08/28/2018 with Dr. Donnetta Hutching. All pre-op instructions unchanged. Verbalized understanding.

## 2018-08-26 ENCOUNTER — Other Ambulatory Visit: Payer: Self-pay

## 2018-08-26 ENCOUNTER — Encounter (HOSPITAL_COMMUNITY): Payer: Self-pay | Admitting: *Deleted

## 2018-08-28 ENCOUNTER — Ambulatory Visit (HOSPITAL_COMMUNITY): Payer: No Typology Code available for payment source | Admitting: Certified Registered Nurse Anesthetist

## 2018-08-28 ENCOUNTER — Encounter (HOSPITAL_COMMUNITY)
Admission: RE | Disposition: A | Discharge status: Home / Self Care | Payer: Self-pay | Source: Home / Self Care | Attending: Vascular Surgery

## 2018-08-28 ENCOUNTER — Ambulatory Visit (HOSPITAL_COMMUNITY)
Admission: RE | Admit: 2018-08-28 | Discharge: 2018-08-28 | Disposition: A | Discharge status: Home / Self Care | Payer: No Typology Code available for payment source | Attending: Vascular Surgery | Admitting: Vascular Surgery

## 2018-08-28 ENCOUNTER — Other Ambulatory Visit: Payer: Self-pay

## 2018-08-28 ENCOUNTER — Encounter (HOSPITAL_COMMUNITY): Payer: Self-pay | Admitting: *Deleted

## 2018-08-28 DIAGNOSIS — N186 End stage renal disease: Secondary | ICD-10-CM

## 2018-08-28 DIAGNOSIS — I132 Hypertensive heart and chronic kidney disease with heart failure and with stage 5 chronic kidney disease, or end stage renal disease: Secondary | ICD-10-CM | POA: Insufficient documentation

## 2018-08-28 DIAGNOSIS — G4730 Sleep apnea, unspecified: Secondary | ICD-10-CM | POA: Diagnosis not present

## 2018-08-28 DIAGNOSIS — Z79899 Other long term (current) drug therapy: Secondary | ICD-10-CM | POA: Diagnosis not present

## 2018-08-28 DIAGNOSIS — Z992 Dependence on renal dialysis: Secondary | ICD-10-CM | POA: Insufficient documentation

## 2018-08-28 DIAGNOSIS — I509 Heart failure, unspecified: Secondary | ICD-10-CM | POA: Insufficient documentation

## 2018-08-28 DIAGNOSIS — K219 Gastro-esophageal reflux disease without esophagitis: Secondary | ICD-10-CM | POA: Insufficient documentation

## 2018-08-28 DIAGNOSIS — N185 Chronic kidney disease, stage 5: Secondary | ICD-10-CM | POA: Diagnosis not present

## 2018-08-28 HISTORY — DX: Heart failure, unspecified: I50.9

## 2018-08-28 HISTORY — PX: AV FISTULA PLACEMENT: SHX1204

## 2018-08-28 HISTORY — DX: Dyspnea, unspecified: R06.00

## 2018-08-28 HISTORY — DX: Unspecified abdominal hernia without obstruction or gangrene: K46.9

## 2018-08-28 HISTORY — DX: Post-traumatic stress disorder, unspecified: F43.10

## 2018-08-28 LAB — POCT I-STAT 4, (NA,K, GLUC, HGB,HCT)
Glucose, Bld: 99 mg/dL (ref 70–99)
HCT: 31 % — ABNORMAL LOW (ref 39.0–52.0)
Hemoglobin: 10.5 g/dL — ABNORMAL LOW (ref 13.0–17.0)
Potassium: 3.9 mmol/L (ref 3.5–5.1)
Sodium: 137 mmol/L (ref 135–145)

## 2018-08-28 SURGERY — ARTERIOVENOUS (AV) FISTULA CREATION
Anesthesia: Monitor Anesthesia Care | Site: Arm Upper | Laterality: Left

## 2018-08-28 MED ORDER — LIDOCAINE 2% (20 MG/ML) 5 ML SYRINGE
INTRAMUSCULAR | Status: DC | PRN
  Administered 2018-08-28: 50 mg via INTRAVENOUS

## 2018-08-28 MED ORDER — SODIUM CHLORIDE 0.9 % IV SOLN
INTRAVENOUS | Status: AC
  Filled 2018-08-28: qty 1.2

## 2018-08-28 MED ORDER — HYDROCODONE-ACETAMINOPHEN 5-325 MG PO TABS: 1.0000 | ORAL_TABLET | Freq: Two times a day (BID) | ORAL | 0 refills | Status: DC | PRN

## 2018-08-28 MED ORDER — MIDAZOLAM HCL 2 MG/2ML IJ SOLN
INTRAMUSCULAR | Status: DC | PRN
  Administered 2018-08-28: 2 mg via INTRAVENOUS

## 2018-08-28 MED ORDER — CEFAZOLIN SODIUM-DEXTROSE 2-4 GM/100ML-% IV SOLN
2.0000 g | INTRAVENOUS | Status: AC
  Administered 2018-08-28: 2 g via INTRAVENOUS

## 2018-08-28 MED ORDER — DEXAMETHASONE SODIUM PHOSPHATE 10 MG/ML IJ SOLN
INTRAMUSCULAR | Status: DC | PRN
  Administered 2018-08-28: 4 mg via INTRAVENOUS

## 2018-08-28 MED ORDER — FENTANYL CITRATE (PF) 250 MCG/5ML IJ SOLN
INTRAMUSCULAR | Status: DC | PRN
  Administered 2018-08-28: 50 ug via INTRAVENOUS

## 2018-08-28 MED ORDER — SODIUM CHLORIDE 0.9 % IV SOLN
INTRAVENOUS | Status: DC | PRN
  Administered 2018-08-28: 12:00:00 500 mL

## 2018-08-28 MED ORDER — 0.9 % SODIUM CHLORIDE (POUR BTL) OPTIME
TOPICAL | Status: DC | PRN
  Administered 2018-08-28: 12:00:00 1000 mL

## 2018-08-28 MED ORDER — MIDAZOLAM HCL 2 MG/2ML IJ SOLN
INTRAMUSCULAR | Status: AC
  Filled 2018-08-28: qty 2

## 2018-08-28 MED ORDER — PROPOFOL 500 MG/50ML IV EMUL
INTRAVENOUS | Status: DC | PRN
  Administered 2018-08-28: 60 ug/kg/min via INTRAVENOUS

## 2018-08-28 MED ORDER — CHLORHEXIDINE GLUCONATE 4 % EX LIQD: 60.0000 mL | Freq: Once | CUTANEOUS | Status: DC

## 2018-08-28 MED ORDER — SODIUM CHLORIDE 0.9 % IV SOLN
INTRAVENOUS | Status: DC
  Administered 2018-08-28 (×2): via INTRAVENOUS

## 2018-08-28 MED ORDER — LIDOCAINE-EPINEPHRINE 0.5 %-1:200000 IJ SOLN
INTRAMUSCULAR | Status: AC
  Filled 2018-08-28: qty 1

## 2018-08-28 MED ORDER — LIDOCAINE-EPINEPHRINE 0.5 %-1:200000 IJ SOLN
INTRAMUSCULAR | Status: DC | PRN
  Administered 2018-08-28: 10 mL

## 2018-08-28 MED ORDER — PROPOFOL 10 MG/ML IV BOLUS
INTRAVENOUS | Status: DC | PRN
  Administered 2018-08-28: 20 mg via INTRAVENOUS

## 2018-08-28 MED ORDER — CEFAZOLIN SODIUM-DEXTROSE 2-4 GM/100ML-% IV SOLN
INTRAVENOUS | Status: AC
  Filled 2018-08-28: qty 100

## 2018-08-28 MED ORDER — EPHEDRINE SULFATE-NACL 50-0.9 MG/10ML-% IV SOSY
PREFILLED_SYRINGE | INTRAVENOUS | Status: DC | PRN
  Administered 2018-08-28: 5 mg via INTRAVENOUS

## 2018-08-28 MED ORDER — ONDANSETRON HCL 4 MG/2ML IJ SOLN
INTRAMUSCULAR | Status: DC | PRN
  Administered 2018-08-28: 4 mg via INTRAVENOUS

## 2018-08-28 MED ORDER — FENTANYL CITRATE (PF) 250 MCG/5ML IJ SOLN
INTRAMUSCULAR | Status: AC
  Filled 2018-08-28: qty 5

## 2018-08-28 MED ORDER — GLYCOPYRROLATE PF 0.2 MG/ML IJ SOSY
PREFILLED_SYRINGE | INTRAMUSCULAR | Status: DC | PRN
  Administered 2018-08-28: .2 mg via INTRAVENOUS

## 2018-08-28 SURGICAL SUPPLY — 34 items
ARMBAND PINK RESTRICT EXTREMIT (MISCELLANEOUS) ×8 IMPLANT
CANISTER SUCT 3000ML PPV (MISCELLANEOUS) ×4 IMPLANT
CANNULA VESSEL 3MM 2 BLNT TIP (CANNULA) ×4 IMPLANT
CLIP LIGATING EXTRA MED SLVR (CLIP) ×4 IMPLANT
CLIP LIGATING EXTRA SM BLUE (MISCELLANEOUS) ×4 IMPLANT
COVER PROBE W GEL 5X96 (DRAPES) ×4 IMPLANT
COVER WAND RF STERILE (DRAPES) ×4 IMPLANT
DECANTER SPIKE VIAL GLASS SM (MISCELLANEOUS) ×4 IMPLANT
DERMABOND ADVANCED (GAUZE/BANDAGES/DRESSINGS) ×2
DERMABOND ADVANCED .7 DNX12 (GAUZE/BANDAGES/DRESSINGS) ×2 IMPLANT
ELECT REM PT RETURN 9FT ADLT (ELECTROSURGICAL) ×4
ELECTRODE REM PT RTRN 9FT ADLT (ELECTROSURGICAL) ×2 IMPLANT
GAUZE SPONGE 4X4 12PLY STRL (GAUZE/BANDAGES/DRESSINGS) ×4 IMPLANT
GLOVE BIO SURGEON STRL SZ 6.5 (GLOVE) ×6 IMPLANT
GLOVE BIO SURGEONS STRL SZ 6.5 (GLOVE) ×2
GLOVE BIOGEL PI IND STRL 6 (GLOVE) ×4 IMPLANT
GLOVE BIOGEL PI INDICATOR 6 (GLOVE) ×4
GLOVE SS BIOGEL STRL SZ 7.5 (GLOVE) ×2 IMPLANT
GLOVE SUPERSENSE BIOGEL SZ 7.5 (GLOVE) ×2
GOWN STRL REUS W/ TWL LRG LVL3 (GOWN DISPOSABLE) ×6 IMPLANT
GOWN STRL REUS W/TWL LRG LVL3 (GOWN DISPOSABLE) ×6
KIT BASIN OR (CUSTOM PROCEDURE TRAY) ×4 IMPLANT
KIT TURNOVER KIT B (KITS) ×4 IMPLANT
NS IRRIG 1000ML POUR BTL (IV SOLUTION) ×4 IMPLANT
PACK CV ACCESS (CUSTOM PROCEDURE TRAY) ×4 IMPLANT
PAD ARMBOARD 7.5X6 YLW CONV (MISCELLANEOUS) ×8 IMPLANT
SUT PROLENE 6 0 CC (SUTURE) ×8 IMPLANT
SUT SILK 2 0 PERMA HAND 18 BK (SUTURE) IMPLANT
SUT VIC AB 3-0 SH 27 (SUTURE) ×4
SUT VIC AB 3-0 SH 27X BRD (SUTURE) ×4 IMPLANT
SYR TOOMEY 50ML (SYRINGE) IMPLANT
TOWEL GREEN STERILE (TOWEL DISPOSABLE) ×4 IMPLANT
UNDERPAD 30X30 (UNDERPADS AND DIAPERS) ×4 IMPLANT
WATER STERILE IRR 1000ML POUR (IV SOLUTION) ×4 IMPLANT

## 2018-08-28 NOTE — H&P (Signed)
Office Visit   07/17/2018 [facility] -Stacie Glaze, Susette Racer, [place]  Physician Assistant   ESRD on dialysis [facility])  Dx   Follow-up   ; Referred by Clinic, [facility]  Reason for Visit   Additional Documentation   Vitals:   BP 139/82 (BP Location: Left Arm, Patient Position: Sitting, Cuff Size: Normal)   Pulse 54    Resp 18   Ht 6' (1.829 m)   Wt 87.2 kg   SpO2 94%   BMI 26.08 kg/m   BSA 2.1 m   Flowsheets:   Clinical Intake,   Vital Signs,   MEWS Score,   Anthropometrics     Encounter Info:   Billing Info,   History,   Allergies,   Detailed Report     All Notes   Progress Notes by Ulyses Amor, PA-C at 07/17/2018 4:00 PM  Author: Ulyses Amor, PA-C Author Type: Physician Assistant Certified Filed: 7/32/2025 4:07 PM  Note Status: Signed Cosign: Cosign Not Required Encounter Date: 07/17/2018  Editor: Orbie Hurst (Physician Assistant Certified)    [place] HISTORY AND PHYSICAL   History of Present Illness:  Patient is a 68 y.o. year old male who presents for placement of a permanent hemodialysis access. Juriel Colbertis a 68 y.o.maletoday for evaluation of his right forearm AV fistula. He had initially had a left arm radiocephalic fistula creation at [facility] which did not mature. He subsequently had a right radiocephalic fistula placed in September 2016. He has had good use of this. He did have difficulty with stenosis and poor flow and underwent fistula angiogram in February 2019. He had angioplasty of the fistula with good result.  He then was scheduled for a repeat fistulaogram with the following results:  Diffuse narrowing outflow vein right radiocephalic AV fistula extending over about 5 cm length. Also aneurysmal degeneration. Fistula not salvageable.                He is here today for follow up and new vein mapping to discuss new access placement.  He has a right [facility]  for HD currently.        Past Medical History:  Diagnosis Date  . Anemia    low iron  . Chronic kidney disease    Dialysis T/Th/Sa  . GERD (gastroesophageal reflux disease)   . Heart murmur   . History of kidney stones   . Hypertension   . Sleep apnea    has a cpap - has not used it in past, but starting soon         Past Surgical History:  Procedure Laterality Date  . A/V FISTULAGRAM N/A 06/28/2018   Procedure: A/V FISTULAGRAM;  Surgeon: Elam Dutch, MD;  Location: [place] [facility];  Service: Cardiovascular;  Laterality: N/A;  . AV FISTULA PLACEMENT Right   . AV FISTULA PLACEMENT Left    this one never used  . COLONOSCOPY    . FISTULOGRAM Right 06/11/2017   Procedure: FISTULOGRAM RIGHT ARTERIOVENOUS FISTULA WITH INTERVENTION;  Surgeon: Conrad W[place], MD;  Location: [place];  Service: Vascular;  Laterality: Right;  . INSERTION OF DIALYSIS CATHETER    . IR GENERIC HISTORICAL  12/02/2015   IR FLUORO GUIDE CV LINE LEFT 12/02/2015 Jacqulynn Cadet, MD [facility]-INTERV RAD  . IR GENERIC HISTORICAL  03/06/2016   IR REMOVAL TUN CV CATH W/O FL 03/06/2016 Markus Daft, MD [facility]-INTERV RAD  .  TEMPORARY DIALYSIS CATHETER  06/28/2018   Procedure: TEMPORARY DIALYSIS CATHETER;  Surgeon: Elam Dutch, MD;  Location: [place] [facility];  Service: Cardiovascular;;  Right Subclavian Dialysis catheter insertion     Social History Social History        Tobacco Use  . Smoking status: Never Smoker  . Smokeless tobacco: Never Used  Substance Use Topics  . Alcohol use: No    Frequency: Never    Comment: years ago, none now  . Drug use: No    Comment: years ago    Family History      Family History  Problem Relation Age of Onset  . Diabetes Mother   . Heart disease Mother   . Heart disease Father     Allergies  No Known Allergies         Current Outpatient Medications  Medication Sig Dispense Refill  . acetaminophen (TYLENOL)  500 MG tablet Take 1,000 mg by mouth every 6 (six) hours as needed for moderate pain.    [name] amLODipine (NORVASC) 10 MG tablet Take 10 mg by mouth daily.    [name] atenolol (TENORMIN) 50 MG tablet Take 50 mg by mouth daily.    . B Complex-C-Folic Acid (DIALYVITE PO) Take 1 tablet by mouth daily.    [name] bismuth subsalicylate (PEPTO BISMOL) 262 MG/15ML suspension Take 30 mLs by mouth every 6 (six) hours as needed for indigestion.    . calcitRIOL (ROCALTROL) 0.25 MCG capsule Take 0.25 mcg by mouth daily.    . febuxostat (ULORIC) 40 MG tablet Take 20 mg by mouth daily as needed (gout flare).     . ferrous sulfate 325 (65 FE) MG tablet Take 325 mg by mouth daily.     . furosemide (LASIX) 80 MG tablet Take 80 mg by mouth daily.     [name] HYDROcodone-acetaminophen (NORCO/VICODIN) 5-325 MG tablet Take 1 tablet by mouth 2 (two) times daily as needed for moderate pain.    [name] lidocaine-prilocaine (EMLA) cream Apply 1 application topically as needed (for access).    [name] omeprazole (PRILOSEC) 20 MG capsule Take 20 mg by mouth 2 (two) times daily.     . sevelamer carbonate (RENVELA) 800 MG tablet Take 800 mg by mouth 3 (three) times daily with meals.     . traZODone (DESYREL) 100 MG tablet Take 100 mg by mouth at bedtime as needed for sleep.     No current facility-administered medications for this visit.     ROS:   General:  No weight loss, Fever, chills  HEENT: No recent headaches, no nasal bleeding, no visual changes, no sore throat  Neurologic: No dizziness, blackouts, seizures. No recent symptoms of stroke or mini- stroke. No recent episodes of slurred speech, or temporary blindness.  Cardiac: No recent episodes of chest pain/pressure, no shortness of breath at rest.  No shortness of breath with exertion.  Denies history of atrial fibrillation or irregular heartbeat  Vascular: No history of rest pain in feet.  No history of claudication.  No history of non-healing ulcer, No  history of DVT   Pulmonary: No home oxygen, no productive cough, no hemoptysis,  No asthma or wheezing  Musculoskeletal:  [ ]  Arthritis, [ ]  Low back pain,  [ ]  Joint pain  Hematologic:No history of hypercoagulable state.  No history of easy bleeding.  No history of anemia  Gastrointestinal: No hematochezia or melena,  No gastroesophageal reflux, no trouble swallowing  Urinary: [ ]  chronic Kidney disease, [x ]  on HD - [ ]  MWF or [x ] TTHS, [ ]  Burning with urination, [ ]  Frequent urination, [ ]  Difficulty urinating;   Skin: No rashes  Psychological: No history of anxiety,  No history of depression   Physical Examination     Vitals:   07/17/18 1523  BP: 139/82  Pulse: (!) 54  Resp: 18  SpO2: 94%  Weight: 192 lb 4.8 oz (87.2 kg)  Height: 6' (1.829 m)    Body mass index is 26.08 kg/m.  General:  Alert and oriented, no acute distress HEENT: Normal Neck: No bruit or JVD Pulmonary: Clear to auscultation bilaterally Cardiac: Regular Rate and Rhythm without murmur Gastrointestinal: Soft, non-tender, non-distended, no mass, no scars Skin: No rash Extremity Pulses:  2+ radial, brachial pulses bilaterally, no thrill in right forearm fistula. Musculoskeletal: No deformity or edema      Neurologic: Upper and lower extremity motor 5/5 and symmetric  DATA:  His vein mapping revealed no useable veins in B UE all < 0.25 cm    ASSESSMENT:  ESRD with 2 failed forearm fistulas.  The right fistula lasted about 3 years.      PLAN: I set him up for left UE AV graft placement in the near future.  He is not on anticoagulation.  He is right hand dominant and has a TDC for HD TTS currently.  Patient agrees with this plan.     Roxy Horseman PA-C [facility] [place] Office: [phone]  MD in clinic [place]    Addendum:  The patient has been re-examined and re-evaluated.  The patient's history and physical has been reviewed  and is unchanged.    Shuan Statzer is a 68 y.o. male is being admitted with END STAGE RENAL DISEASE FOE HEMODIALYSIS ACCESS. All the risks, benefits and other treatment options have been discussed with the patient. The patient has consented to proceed with Procedure(s): INSERTION OF ARTERIOVENOUS (AV) GORE-TEX GRAFT LEFT ARM as a surgical intervention.  Herschel Fleagle 08/28/2018 9:51 AM [facility]

## 2018-08-28 NOTE — Discharge Instructions (Signed)
° °  [facility]  Discharge Instructions  AV Fistula or Graft Surgery for Dialysis Access  Please refer to the following instructions for your post-procedure care. Your surgeon or physician assistant will discuss any changes with you.  Activity  You may drive the day following your surgery, if you are comfortable and no longer taking prescription pain medication. Resume full activity as the soreness in your incision resolves.  Bathing/Showering  You may shower after you go home. Keep your incision dry for 48 hours. Do not soak in a bathtub, hot tub, or swim until the incision heals completely. You may not shower if you have a hemodialysis catheter.  Incision Care  Clean your incision with mild soap and water after 48 hours. Pat the area dry with a clean towel. You do not need a bandage unless otherwise instructed. Do not apply any ointments or creams to your incision. You may have skin glue on your incision. Do not peel it off. It will come off on its own in about one week. Your arm may swell a bit after surgery. To reduce swelling use pillows to elevate your arm so it is above your heart. Your doctor will tell you if you need to lightly wrap your arm with an ACE bandage.  Diet  Resume your normal diet. There are not special food restrictions following this procedure. In order to heal from your surgery, it is CRITICAL to get adequate nutrition. Your body requires vitamins, minerals, and protein. Vegetables are the best source of vitamins and minerals. Vegetables also provide the perfect balance of protein. Processed food has little nutritional value, so try to avoid this.  Medications  Resume taking all of your medications. If your incision is causing pain, you may take over-the counter pain relievers such as acetaminophen (Tylenol). If you were prescribed a stronger pain medication, please be aware these medications can cause nausea and constipation. Prevent  nausea by taking the medication with a snack or meal. Avoid constipation by drinking plenty of fluids and eating foods with high amount of fiber, such as fruits, vegetables, and grains.  Do not take Tylenol if you are taking prescription pain medications.  Follow up Your surgeon may want to see you in the office following your access surgery. If so, this will be arranged at the time of your surgery.  Please call us immediately for any of the following conditions:  Increased pain, redness, drainage (pus) from your incision site Fever of 101 degrees or higher Severe or worsening pain at your incision site Hand pain or numbness.  Reduce your risk of vascular disease:  Stop smoking. If you would like help, call [facility] at 1-800-QUIT-NOW 506-654-5541) or [place] at [place] your cholesterol Maintain a desired weight Control your diabetes Keep your blood pressure down  Dialysis  It will take several weeks to several months for your new dialysis access to be ready for use. Your surgeon will determine when it is okay to use it. Your nephrologist will continue to direct your dialysis. You can continue to use your Permcath until your new access is ready for use.   08/28/2018 Kevin Walker [id number] 1951-03-03  Surgeon(s): Early, Arvilla Meres, MD  Procedure(s): Left 1st stage basilic vein transposition  x Do not stick fistula for 12 weeks    If you have any questions, please call the office at [phone].

## 2018-08-28 NOTE — Anesthesia Postprocedure Evaluation (Signed)
Anesthesia Post Note  Patient: Kevin Walker  Procedure(s) Performed: Arteriovenous (Av) Fistula Creation (Left Arm Upper)     Patient location during evaluation: PACU Anesthesia Type: MAC Level of consciousness: awake and alert Pain management: pain level controlled Vital Signs Assessment: post-procedure vital signs reviewed and stable Respiratory status: spontaneous breathing, nonlabored ventilation, respiratory function stable and patient connected to nasal cannula oxygen Cardiovascular status: stable and blood pressure returned to baseline Postop Assessment: no apparent nausea or vomiting Anesthetic complications: no    Last Vitals:  Vitals:   08/28/18 1345 08/28/18 1400  BP: 119/71 130/79  Pulse: 63 63  Resp: (!) 23 15  Temp:    SpO2: 100% 97%    Last Pain:  Vitals:   08/28/18 1400  TempSrc:   PainSc: 0-No pain                 Barnet Glasgow

## 2018-08-28 NOTE — Anesthesia Preprocedure Evaluation (Addendum)
Anesthesia Evaluation  Patient identified by MRN, date of birth, ID band Patient awake    Reviewed: Allergy & Precautions, H&P , NPO status , Patient's Chart, lab work & pertinent test results  Airway Mallampati: II  TM Distance: >3 FB Neck ROM: Full    Dental no notable dental hx. (+) Partial Upper, Partial Lower,    Pulmonary sleep apnea ,    Pulmonary exam normal breath sounds clear to auscultation       Cardiovascular hypertension, Pt. on medications +CHF  Normal cardiovascular exam Rhythm:Regular Rate:Normal     Neuro/Psych negative neurological ROS     GI/Hepatic GERD  ,  Endo/Other    Renal/GU DialysisRenal diseaseT,TH, SA     Musculoskeletal   Abdominal   Peds  Hematology  (+) Blood dyscrasia, anemia ,   Anesthesia Other Findings   Reproductive/Obstetrics                             Anesthesia Physical Anesthesia Plan  ASA: III  Anesthesia Plan: MAC   Post-op Pain Management:    Induction: Intravenous  PONV Risk Score and Plan: Treatment may vary due to age or medical condition, Ondansetron and Dexamethasone  Airway Management Planned: Simple Face Mask and Natural Airway  Additional Equipment:   Intra-op Plan:   Post-operative Plan: Extubation in OR  Informed Consent: I have reviewed the patients History and Physical, chart, labs and discussed the procedure including the risks, benefits and alternatives for the proposed anesthesia with the patient or authorized representative who has indicated his/her understanding and acceptance.     Dental advisory given  Plan Discussed with: CRNA  Anesthesia Plan Comments:        Anesthesia Quick Evaluation

## 2018-08-28 NOTE — Op Note (Signed)
    OPERATIVE REPORT  DATE OF SURGERY: 08/28/2018  PATIENT: Kevin Walker, 68 y.o. male MRN: [id number]  DOB: 08-11-1950  PRE-OPERATIVE DIAGNOSIS: End-stage renal disease  POST-OPERATIVE DIAGNOSIS:  Same  PROCEDURE: Left first stage basilic vein fistula  SURGEON:  Curt Jews, M.D.  PHYSICIAN ASSISTANT: Liana Crocker, PA-C  ANESTHESIA: Local with sedation  EBL: per anesthesia record  Total I/O In: -  Out: 5 [Blood:5]  BLOOD ADMINISTERED: none  DRAINS: none  SPECIMEN: none  COUNTS CORRECT:  YES  PATIENT DISPOSITION:  PACU - hemodynamically stable  PROCEDURE DETAILS: The patient was taken up and placed to position the area of the left arm prepped draped in sterile fashion.  SonoSite ultrasound was used to visualize the basilic vein which was of good caliber.  It was actually visible on the surface below the antecubital space.  The patient is very tortuous brachial artery throughout the upper arm.  Incision was made using local anesthesia over the basilic vein at the level of the elbow and this was of good caliber.  Tributary branches were ligated with 3-0 and 4-0 silk ties and divided.  A separate incision was made over the antecubital space and the brachial artery was exposed and was of excellent caliber.  A tunnel was created between the level of the basilic vein and the brachial artery.  The vein was ligated distally and was divided and was brought into approximation of the brachial artery.  The artery was occluded proximally distally and was opened with an 11 blade and sent longitudinally with Potts scissors.  The vein was cut to the appropriate length and was spatulated and sewn end-to-side to the artery with a running 6-0 Prolene suture.  Clamps removed and excellent thrill was noted.  Wounds irrigated with saline.  Hemostasis electrocautery.  The wounds were closed with 3-0 Vicryl in the subcutaneous and subcuticular tissue   Rosetta Posner, M.D., [facility] 08/28/2018 3:17 PM

## 2018-08-28 NOTE — Anesthesia Procedure Notes (Signed)
Procedure Name: MAC Date/Time: 08/28/2018 12:59 PM Performed by: Teressa Lower., CRNA Pre-anesthesia Checklist: Patient identified, Emergency Drugs available, Suction available, Patient being monitored and Timeout performed Patient Re-evaluated:Patient Re-evaluated prior to induction Oxygen Delivery Method: Simple face mask Ventilation: Oral airway inserted - appropriate to patient size Airway Equipment and Method: Oral airway

## 2018-08-28 NOTE — Transfer of Care (Signed)
Immediate Anesthesia Transfer of Care Note  Patient: Kevin Walker  Procedure(s) Performed: Arteriovenous (Av) Fistula Creation (Left Arm Upper)  Patient Location: PACU  Anesthesia Type:MAC  Level of Consciousness: awake, alert  and oriented  Airway & Oxygen Therapy: Patient Spontanous Breathing and Patient connected to face mask oxygen  Post-op Assessment: Report given to RN and Post -op Vital signs reviewed and stable  Post vital signs: Reviewed and stable  Last Vitals:  Vitals Value Taken Time  BP    Temp    Pulse 63 08/28/2018  1:42 PM  Resp 16 08/28/2018  1:42 PM  SpO2 100 % 08/28/2018  1:42 PM  Vitals shown include unvalidated device data.  Last Pain:  Vitals:   08/28/18 0921  TempSrc:   PainSc: 0-No pain      Patients Stated Pain Goal: 4 (71/24/58 0998)  Complications: No apparent anesthesia complications

## 2018-08-29 ENCOUNTER — Encounter (HOSPITAL_COMMUNITY): Payer: Self-pay | Admitting: Vascular Surgery

## 2018-10-03 ENCOUNTER — Other Ambulatory Visit: Payer: Self-pay

## 2018-10-03 DIAGNOSIS — N186 End stage renal disease: Secondary | ICD-10-CM

## 2018-10-03 DIAGNOSIS — Z992 Dependence on renal dialysis: Secondary | ICD-10-CM

## 2018-10-07 ENCOUNTER — Telehealth (HOSPITAL_COMMUNITY): Payer: Self-pay | Admitting: Rehabilitation

## 2018-10-07 NOTE — Telephone Encounter (Signed)

## 2018-10-08 ENCOUNTER — Encounter (HOSPITAL_COMMUNITY): Payer: Non-veteran care

## 2018-10-08 ENCOUNTER — Encounter: Payer: Self-pay | Admitting: Vascular Surgery

## 2018-10-14 ENCOUNTER — Telehealth (HOSPITAL_COMMUNITY): Payer: Self-pay | Admitting: Rehabilitation

## 2018-10-14 NOTE — Telephone Encounter (Signed)

## 2018-10-15 ENCOUNTER — Other Ambulatory Visit: Payer: Self-pay

## 2018-10-15 ENCOUNTER — Ambulatory Visit (INDEPENDENT_AMBULATORY_CARE_PROVIDER_SITE_OTHER): Payer: Self-pay | Admitting: Vascular Surgery

## 2018-10-15 ENCOUNTER — Encounter: Payer: Self-pay | Admitting: *Deleted

## 2018-10-15 ENCOUNTER — Encounter: Payer: Self-pay | Admitting: Vascular Surgery

## 2018-10-15 ENCOUNTER — Ambulatory Visit (HOSPITAL_COMMUNITY)
Admission: RE | Admit: 2018-10-15 | Discharge: 2018-10-15 | Disposition: A | Discharge status: Home / Self Care | Payer: No Typology Code available for payment source | Source: Ambulatory Visit | Attending: Vascular Surgery | Admitting: Vascular Surgery

## 2018-10-15 VITALS — BP 132/71 | HR 63 | Temp 97.8°F | Resp 20 | Ht 72.0 in | Wt 189.4 lb

## 2018-10-15 DIAGNOSIS — Z992 Dependence on renal dialysis: Secondary | ICD-10-CM

## 2018-10-15 DIAGNOSIS — N186 End stage renal disease: Secondary | ICD-10-CM | POA: Diagnosis not present

## 2018-10-15 NOTE — H&P (View-Only) (Signed)
   Patient name: Kevin Walker MRN: [id number] DOB: 29-Oct-1950 Sex: male  REASON FOR VISIT: Follow-up left first stage brachiobasilic fistula creation on 08/28/2018  HPI: [name] is a 68 y.o. male to today here for follow-up.  At first age brachiobasilic fistula creation by myself on 08/28/2018.  He has no pain associated with this and no steal symptoms.  He reports that his catheter is still working well.  Current Outpatient Medications  Medication Sig Dispense Refill  . acetaminophen (TYLENOL) 500 MG tablet Take 1,000 mg by mouth every 6 (six) hours as needed for moderate pain.    [name] amLODipine (NORVASC) 10 MG tablet Take 10 mg by mouth daily.    [name] atenolol (TENORMIN) 50 MG tablet Take 50 mg by mouth daily.    . B Complex-C-Folic Acid (DIALYVITE PO) Take 1 tablet by mouth daily.    . bisacodyl (DULCOLAX) 5 MG EC tablet Take 5 mg by mouth daily.    [name] bismuth subsalicylate (PEPTO BISMOL) 262 MG/15ML suspension Take 30 mLs by mouth every 6 (six) hours as needed for indigestion.    . calcitRIOL (ROCALTROL) 0.25 MCG capsule Take 0.25 mcg by mouth daily.    . febuxostat (ULORIC) 40 MG tablet Take 20 mg by mouth daily as needed (gout flare).     . ferrous sulfate 325 (65 FE) MG tablet Take 325 mg by mouth daily.     . furosemide (LASIX) 80 MG tablet Take 80 mg by mouth daily.     [name] HYDROcodone-acetaminophen (NORCO/VICODIN) 5-325 MG tablet Take 1 tablet by mouth 2 (two) times daily as needed for moderate pain. 8 tablet 0  . latanoprost (XALATAN) 0.005 % ophthalmic solution Place 1 drop into both eyes at bedtime.    . lidocaine-prilocaine (EMLA) cream Apply 1 application topically as needed (for access).    [name] omeprazole (PRILOSEC) 20 MG capsule Take 20 mg by mouth 2 (two) times daily.     . Polyvinyl Alcohol-Povidone (REFRESH OP) Place 1 drop into both eyes 3 (three) times daily.    . sevelamer carbonate (RENVELA) 800 MG tablet Take 800 mg by mouth 3 (three)  times daily with meals.     . traZODone (DESYREL) 100 MG tablet Take 100 mg by mouth at bedtime.      No current facility-administered medications for this visit.      PHYSICAL EXAM: Vitals:   10/15/18 1523  BP: 132/71  Pulse: 63  Resp: 20  Temp: 97.8 F (36.6 C)  SpO2: 96%  Weight: 189 lb 6.4 oz (85.9 kg)  Height: 6' (1.829 m)    GENERAL: The patient is a well-nourished male, in no acute distress. The vital signs are documented above. Healed left antecubital incision.  Excellent thrill noted  Duplex shows moderate dilatation in the distal upper arm and good size maturation in the mid and upper upper arm.  MEDICAL ISSUES: Image his veins with sinusitis well.  This does look adequate for fistula creation.  Will be scheduled for second stage basilic vein fistula transposition on a nondialysis day on Monday Wednesday or Friday in the next 1 to 2 weeks   Rosetta Posner, MD [facility] of [facility] Tel [phone] Pager [phone]

## 2018-10-15 NOTE — Progress Notes (Signed)
   Patient name: Kevin Walker MRN: [id number] DOB: 24-Nov-1950 Sex: male  REASON FOR VISIT: Follow-up left first stage brachiobasilic fistula creation on 08/28/2018  HPI: Kevin Walker is a 68 y.o. male to today here for follow-up.  At first age brachiobasilic fistula creation by myself on 08/28/2018.  He has no pain associated with this and no steal symptoms.  He reports that his catheter is still working well.  Current Outpatient Medications  Medication Sig Dispense Refill  . acetaminophen (TYLENOL) 500 MG tablet Take 1,000 mg by mouth every 6 (six) hours as needed for moderate pain.    [name] amLODipine (NORVASC) 10 MG tablet Take 10 mg by mouth daily.    [name] atenolol (TENORMIN) 50 MG tablet Take 50 mg by mouth daily.    . B Complex-C-Folic Acid (DIALYVITE PO) Take 1 tablet by mouth daily.    . bisacodyl (DULCOLAX) 5 MG EC tablet Take 5 mg by mouth daily.    [name] bismuth subsalicylate (PEPTO BISMOL) 262 MG/15ML suspension Take 30 mLs by mouth every 6 (six) hours as needed for indigestion.    . calcitRIOL (ROCALTROL) 0.25 MCG capsule Take 0.25 mcg by mouth daily.    . febuxostat (ULORIC) 40 MG tablet Take 20 mg by mouth daily as needed (gout flare).     . ferrous sulfate 325 (65 FE) MG tablet Take 325 mg by mouth daily.     . furosemide (LASIX) 80 MG tablet Take 80 mg by mouth daily.     [name] HYDROcodone-acetaminophen (NORCO/VICODIN) 5-325 MG tablet Take 1 tablet by mouth 2 (two) times daily as needed for moderate pain. 8 tablet 0  . latanoprost (XALATAN) 0.005 % ophthalmic solution Place 1 drop into both eyes at bedtime.    . lidocaine-prilocaine (EMLA) cream Apply 1 application topically as needed (for access).    [name] omeprazole (PRILOSEC) 20 MG capsule Take 20 mg by mouth 2 (two) times daily.     . Polyvinyl Alcohol-Povidone (REFRESH OP) Place 1 drop into both eyes 3 (three) times daily.    . sevelamer carbonate (RENVELA) 800 MG tablet Take 800 mg by mouth 3 (three)  times daily with meals.     . traZODone (DESYREL) 100 MG tablet Take 100 mg by mouth at bedtime.      No current facility-administered medications for this visit.      PHYSICAL EXAM: Vitals:   10/15/18 1523  BP: 132/71  Pulse: 63  Resp: 20  Temp: 97.8 F (36.6 C)  SpO2: 96%  Weight: 189 lb 6.4 oz (85.9 kg)  Height: 6' (1.829 m)    GENERAL: The patient is a well-nourished male, in no acute distress. The vital signs are documented above. Healed left antecubital incision.  Excellent thrill noted  Duplex shows moderate dilatation in the distal upper arm and good size maturation in the mid and upper upper arm.  MEDICAL ISSUES: Image his veins with sinusitis well.  This does look adequate for fistula creation.  Will be scheduled for second stage basilic vein fistula transposition on a nondialysis day on Monday Wednesday or Friday in the next 1 to 2 weeks   Rosetta Posner, MD [facility] of [facility] Tel [phone] Pager [phone]

## 2018-10-18 ENCOUNTER — Other Ambulatory Visit: Payer: Self-pay | Admitting: *Deleted

## 2018-10-18 ENCOUNTER — Telehealth: Payer: Self-pay | Admitting: *Deleted

## 2018-10-18 NOTE — Telephone Encounter (Signed)
Call to patient instructions for Covid nasal swab at [facility] on 11/07/2018 @ 3pm.all instructions given.

## 2018-11-06 ENCOUNTER — Encounter (HOSPITAL_COMMUNITY): Payer: Self-pay

## 2018-11-06 ENCOUNTER — Other Ambulatory Visit: Payer: Self-pay

## 2018-11-06 NOTE — Progress Notes (Addendum)
PCP - Dr. Annitta [facility]  Cardiologist - Denies  Chest x-ray - 06/28/2018 (E)  EKG - 06/28/2018 (E)  Stress Test - Denies  ECHO - Denies  Cardiac Cath - 2018- Req'd  AICD-na PM-na LOOP-na  Sleep Study - Yes-Positive CPAP - None  LABS- 11/11/2018: I-STAT-4 11/07/2018: COVID  ASA-None  ERAS-No   Anesthesia- Yes, req'd cardiac cath report  Pt denies having chest pain, sob, or fever during the pre-op phone call. All instructions explained to the pt, with a verbal understanding of the material, including: As of today, stop taking all Other Aspirin Products, Vitamins, Fish oils, and Herbal medications. Also stop all NSAIDS i.e. Advil, Ibuprofen, Motrin, Aleve, Anaprox, Naproxen, BC, Goody Powders, and all Supplements. Pt also reminded to self quarantine after being tested for COVID-19. The opportunity to ask questions was provided.   Coronavirus Screening  Have you experienced the following symptoms:  Cough yes/no: No Fever (>100.35F)  yes/no: No Runny nose yes/no: No Sore throat yes/no: No Difficulty breathing/shortness of breath  yes/no: No  Have you or a family member traveled in the last 14 days and where? yes/no: No   If the patient indicates "YES" to the above questions, their PAT will be rescheduled to limit the exposure to others and, the surgeon will be notified. THE PATIENT WILL NEED TO BE ASYMPTOMATIC FOR 14 DAYS.   If the patient is not experiencing any of these symptoms, the PAT nurse will instruct them to NOT bring anyone with them to their appointment since they may have these symptoms or traveled as well.   Please remind your patients and families that hospital visitation restrictions are in effect and the importance of the restrictions.

## 2018-11-07 ENCOUNTER — Other Ambulatory Visit (HOSPITAL_COMMUNITY)
Admission: RE | Admit: 2018-11-07 | Discharge: 2018-11-07 | Disposition: A | Discharge status: Home / Self Care | Payer: Medicare Other | Source: Ambulatory Visit | Attending: Vascular Surgery | Admitting: Vascular Surgery

## 2018-11-07 DIAGNOSIS — Z01812 Encounter for preprocedural laboratory examination: Secondary | ICD-10-CM | POA: Insufficient documentation

## 2018-11-07 DIAGNOSIS — Z1159 Encounter for screening for other viral diseases: Secondary | ICD-10-CM | POA: Insufficient documentation

## 2018-11-07 LAB — SARS CORONAVIRUS 2 (TAT 6-24 HRS): SARS Coronavirus 2: NEGATIVE

## 2018-11-07 NOTE — Anesthesia Preprocedure Evaluation (Addendum)
Anesthesia Evaluation  Patient identified by MRN, date of birth, ID band Patient awake    Reviewed: Allergy & Precautions, NPO status , Patient's Chart, lab work & pertinent test results  Airway Mallampati: II  TM Distance: >3 FB Neck ROM: Full    Dental no notable dental hx.    Pulmonary sleep apnea ,    Pulmonary exam normal breath sounds clear to auscultation       Cardiovascular hypertension, Normal cardiovascular exam Rhythm:Regular Rate:Normal     Neuro/Psych negative neurological ROS  negative psych ROS   GI/Hepatic negative GI ROS, Neg liver ROS,   Endo/Other  negative endocrine ROS  Renal/GU DialysisRenal disease  negative genitourinary   Musculoskeletal negative musculoskeletal ROS (+)   Abdominal   Peds negative pediatric ROS (+)  Hematology  (+) anemia ,   Anesthesia Other Findings   Reproductive/Obstetrics negative OB ROS                            Anesthesia Physical Anesthesia Plan  ASA: IV  Anesthesia Plan: MAC   Post-op Pain Management:    Induction: Intravenous  PONV Risk Score and Plan: 1 and Ondansetron  Airway Management Planned: Simple Face Mask  Additional Equipment:   Intra-op Plan:   Post-operative Plan:   Informed Consent: I have reviewed the patients History and Physical, chart, labs and discussed the procedure including the risks, benefits and alternatives for the proposed anesthesia with the patient or authorized representative who has indicated his/her understanding and acceptance.     Dental advisory given  Plan Discussed with: CRNA and Surgeon  Anesthesia Plan Comments: (Cath 10/03/17 ([place], copy on pt chart): Final diagnosis: 1.  Minimal nonobstructive CAD and single-vessel (20% and distal LAD), otherwise widely patent coronaries. 2.  Mod-severe elevated LVEDP and systemic pressures. Recommendations: 1.  No coronary intervention needed.   Continue risk factor modification and medical therapies for BP including dialysis.  Nuclear stress 02/21/2017 ([place], copy on patient chart): Conclusions: 1.  Myocardial perfusion stress study is abnormal.  There is a fixed inferior defect with associated wall motion abnormality that likely represents infarct with mild peri-infarct ischemia. 2.  Overall left ventricular systolic function is mildly reduced.  The overall calculated ventricular ejection fraction 45%.  Mild distal inferior hypokinesis. 3.  Intermediate cardiovascular risk study.  TTE 12/22/16 ([facility], copy on [facility] chart): Summary: 1.  The left ventricle is normal in size.  There is mild concentric left ventricular hypertrophy.  Left ventricular systolic function is normal.  Ejection fraction equals 60-65%.  No regional wall motion normalities noted.  No diastolic dysfunction parameters suggest elevated left atrial pressure or congestive heart failure. 2.  The right ventricle is normal in size and function. 3.  Left atrial volume is severely increased.  Right atrial size is normal. 4.  There is mild mitral regurgitation. 5.  Mild aortic root dilatation.  Aortic root measures 4.1 cm. 6.  There is an echolucent region along the posterior and basal aspect left atrium, which most likely represents a localized pericardial effusion.  It is not significant hemodynamically.  )      Anesthesia Quick Evaluation

## 2018-11-11 ENCOUNTER — Ambulatory Visit (HOSPITAL_COMMUNITY): Payer: No Typology Code available for payment source | Admitting: Physician Assistant

## 2018-11-11 ENCOUNTER — Other Ambulatory Visit: Payer: Self-pay

## 2018-11-11 ENCOUNTER — Encounter (HOSPITAL_COMMUNITY): Payer: Self-pay

## 2018-11-11 ENCOUNTER — Encounter (HOSPITAL_COMMUNITY)
Admission: RE | Disposition: A | Discharge status: Home / Self Care | Payer: Self-pay | Source: Home / Self Care | Attending: Vascular Surgery

## 2018-11-11 ENCOUNTER — Ambulatory Visit (HOSPITAL_COMMUNITY)
Admission: RE | Admit: 2018-11-11 | Discharge: 2018-11-11 | Disposition: A | Discharge status: Home / Self Care | Payer: No Typology Code available for payment source | Attending: Vascular Surgery | Admitting: Vascular Surgery

## 2018-11-11 DIAGNOSIS — I120 Hypertensive chronic kidney disease with stage 5 chronic kidney disease or end stage renal disease: Secondary | ICD-10-CM | POA: Insufficient documentation

## 2018-11-11 DIAGNOSIS — D631 Anemia in chronic kidney disease: Secondary | ICD-10-CM | POA: Diagnosis not present

## 2018-11-11 DIAGNOSIS — Z79899 Other long term (current) drug therapy: Secondary | ICD-10-CM | POA: Diagnosis not present

## 2018-11-11 DIAGNOSIS — G4730 Sleep apnea, unspecified: Secondary | ICD-10-CM | POA: Insufficient documentation

## 2018-11-11 DIAGNOSIS — N186 End stage renal disease: Secondary | ICD-10-CM | POA: Diagnosis not present

## 2018-11-11 DIAGNOSIS — Z992 Dependence on renal dialysis: Secondary | ICD-10-CM | POA: Insufficient documentation

## 2018-11-11 DIAGNOSIS — N185 Chronic kidney disease, stage 5: Secondary | ICD-10-CM | POA: Diagnosis not present

## 2018-11-11 HISTORY — PX: BASCILIC VEIN TRANSPOSITION: SHX5742

## 2018-11-11 SURGERY — TRANSPOSITION, VEIN, BASILIC
Anesthesia: Monitor Anesthesia Care | Site: Arm Upper | Laterality: Left

## 2018-11-11 MED ORDER — LIDOCAINE 2% (20 MG/ML) 5 ML SYRINGE
INTRAMUSCULAR | Status: AC
  Filled 2018-11-11: qty 10

## 2018-11-11 MED ORDER — SODIUM CHLORIDE 0.9 % IV SOLN
INTRAVENOUS | Status: DC
  Administered 2018-11-11 (×2): via INTRAVENOUS

## 2018-11-11 MED ORDER — EPHEDRINE 5 MG/ML INJ
INTRAVENOUS | Status: AC
  Filled 2018-11-11: qty 10

## 2018-11-11 MED ORDER — FENTANYL CITRATE (PF) 250 MCG/5ML IJ SOLN
INTRAMUSCULAR | Status: AC
  Filled 2018-11-11: qty 5

## 2018-11-11 MED ORDER — ONDANSETRON HCL 4 MG/2ML IJ SOLN
INTRAMUSCULAR | Status: DC | PRN
  Administered 2018-11-11: 4 mg via INTRAVENOUS

## 2018-11-11 MED ORDER — ROCURONIUM BROMIDE 10 MG/ML (PF) SYRINGE
PREFILLED_SYRINGE | INTRAVENOUS | Status: AC
  Filled 2018-11-11: qty 10

## 2018-11-11 MED ORDER — MIDAZOLAM HCL 5 MG/5ML IJ SOLN
INTRAMUSCULAR | Status: DC | PRN
  Administered 2018-11-11: 1 mg via INTRAVENOUS

## 2018-11-11 MED ORDER — SUCCINYLCHOLINE CHLORIDE 200 MG/10ML IV SOSY
PREFILLED_SYRINGE | INTRAVENOUS | Status: AC
  Filled 2018-11-11: qty 10

## 2018-11-11 MED ORDER — CHLORHEXIDINE GLUCONATE 4 % EX LIQD: 60.0000 mL | Freq: Once | CUTANEOUS | Status: DC

## 2018-11-11 MED ORDER — LIDOCAINE-EPINEPHRINE 0.5 %-1:200000 IJ SOLN
INTRAMUSCULAR | Status: AC
  Filled 2018-11-11: qty 1

## 2018-11-11 MED ORDER — MIDAZOLAM HCL 2 MG/2ML IJ SOLN
INTRAMUSCULAR | Status: AC
  Filled 2018-11-11: qty 2

## 2018-11-11 MED ORDER — SODIUM CHLORIDE 0.9 % IV SOLN
INTRAVENOUS | Status: DC | PRN
  Administered 2018-11-11: 09:00:00 500 mL

## 2018-11-11 MED ORDER — SODIUM CHLORIDE 0.9 % IV SOLN
INTRAVENOUS | Status: AC
  Filled 2018-11-11: qty 1.2

## 2018-11-11 MED ORDER — 0.9 % SODIUM CHLORIDE (POUR BTL) OPTIME
TOPICAL | Status: DC | PRN
  Administered 2018-11-11: 1000 mL

## 2018-11-11 MED ORDER — GLYCOPYRROLATE PF 0.2 MG/ML IJ SOSY
PREFILLED_SYRINGE | INTRAMUSCULAR | Status: DC | PRN
  Administered 2018-11-11 (×2): .1 mg via INTRAVENOUS

## 2018-11-11 MED ORDER — CEFAZOLIN SODIUM-DEXTROSE 2-4 GM/100ML-% IV SOLN
INTRAVENOUS | Status: AC
  Filled 2018-11-11: qty 100

## 2018-11-11 MED ORDER — CEFAZOLIN SODIUM-DEXTROSE 2-4 GM/100ML-% IV SOLN
2.0000 g | INTRAVENOUS | Status: AC
  Administered 2018-11-11: 09:00:00 2 g via INTRAVENOUS

## 2018-11-11 MED ORDER — PHENYLEPHRINE 40 MCG/ML (10ML) SYRINGE FOR IV PUSH (FOR BLOOD PRESSURE SUPPORT)
PREFILLED_SYRINGE | INTRAVENOUS | Status: AC
  Filled 2018-11-11: qty 10

## 2018-11-11 MED ORDER — PROPOFOL 10 MG/ML IV BOLUS
INTRAVENOUS | Status: DC | PRN
  Administered 2018-11-11: 150 mg via INTRAVENOUS

## 2018-11-11 MED ORDER — PROMETHAZINE HCL 25 MG/ML IJ SOLN: 6.2500 mg | INTRAMUSCULAR | Status: DC | PRN

## 2018-11-11 MED ORDER — FENTANYL CITRATE (PF) 100 MCG/2ML IJ SOLN: 25.0000 ug | INTRAMUSCULAR | Status: DC | PRN

## 2018-11-11 MED ORDER — FENTANYL CITRATE (PF) 100 MCG/2ML IJ SOLN
INTRAMUSCULAR | Status: DC | PRN
  Administered 2018-11-11 (×3): 50 ug via INTRAVENOUS

## 2018-11-11 SURGICAL SUPPLY — 32 items
ARMBAND PINK RESTRICT EXTREMIT (MISCELLANEOUS) ×3 IMPLANT
CANISTER SUCT 3000ML PPV (MISCELLANEOUS) ×3 IMPLANT
CANNULA VESSEL 3MM 2 BLNT TIP (CANNULA) ×3 IMPLANT
CLIP LIGATING EXTRA MED SLVR (CLIP) ×3 IMPLANT
CLIP LIGATING EXTRA SM BLUE (MISCELLANEOUS) ×3 IMPLANT
COVER PROBE W GEL 5X96 (DRAPES) ×3 IMPLANT
COVER WAND RF STERILE (DRAPES) IMPLANT
DECANTER SPIKE VIAL GLASS SM (MISCELLANEOUS) IMPLANT
DERMABOND ADHESIVE PROPEN (GAUZE/BANDAGES/DRESSINGS) ×2
DERMABOND ADVANCED (GAUZE/BANDAGES/DRESSINGS) ×2
DERMABOND ADVANCED .7 DNX12 (GAUZE/BANDAGES/DRESSINGS) ×1 IMPLANT
DERMABOND ADVANCED .7 DNX6 (GAUZE/BANDAGES/DRESSINGS) ×1 IMPLANT
ELECT REM PT RETURN 9FT ADLT (ELECTROSURGICAL) ×3
ELECTRODE REM PT RTRN 9FT ADLT (ELECTROSURGICAL) ×1 IMPLANT
GLOVE SS BIOGEL STRL SZ 7.5 (GLOVE) ×1 IMPLANT
GLOVE SUPERSENSE BIOGEL SZ 7.5 (GLOVE) ×2
GOWN STRL REUS W/ TWL LRG LVL3 (GOWN DISPOSABLE) ×3 IMPLANT
GOWN STRL REUS W/TWL LRG LVL3 (GOWN DISPOSABLE) ×6
KIT BASIN OR (CUSTOM PROCEDURE TRAY) ×3 IMPLANT
KIT TURNOVER KIT B (KITS) ×3 IMPLANT
NS IRRIG 1000ML POUR BTL (IV SOLUTION) ×3 IMPLANT
PACK CV ACCESS (CUSTOM PROCEDURE TRAY) ×3 IMPLANT
PAD ARMBOARD 7.5X6 YLW CONV (MISCELLANEOUS) ×6 IMPLANT
SUT PROLENE 6 0 CC (SUTURE) ×3 IMPLANT
SUT SILK 2 0 SH (SUTURE) ×3 IMPLANT
SUT SILK 3 0 (SUTURE) ×4
SUT SILK 3-0 18XBRD TIE 12 (SUTURE) ×2 IMPLANT
SUT VIC AB 3-0 SH 27 (SUTURE) ×4
SUT VIC AB 3-0 SH 27X BRD (SUTURE) ×2 IMPLANT
TOWEL GREEN STERILE (TOWEL DISPOSABLE) ×3 IMPLANT
UNDERPAD 30X30 (UNDERPADS AND DIAPERS) ×3 IMPLANT
WATER STERILE IRR 1000ML POUR (IV SOLUTION) ×3 IMPLANT

## 2018-11-11 NOTE — Interval H&P Note (Signed)
History and Physical Interval Note:  11/11/2018 7:32 AM  Kevin Walker  has presented today for surgery, with the diagnosis of END STAGE RENAL DISEASE FOR HEMODIALYSIS ACCESS.  The various methods of treatment have been discussed with the patient and family. After consideration of risks, benefits and other options for treatment, the patient has consented to  Procedure(s): SECOND STAGE BASILIC VEIN TRANSPOSITION LEFT ARM (Left) as a surgical intervention.  The patient's history has been reviewed, patient examined, no change in status, stable for surgery.  I have reviewed the patient's chart and labs.  Questions were answered to the patient's satisfaction.     Curt Jews

## 2018-11-11 NOTE — Discharge Instructions (Signed)
° °  [facility]  Discharge Instructions  AV Fistula or Graft Surgery for Dialysis Access  Please refer to the following instructions for your post-procedure care. Your surgeon or physician assistant will discuss any changes with you.  Activity  You may drive the day following your surgery, if you are comfortable and no longer taking prescription pain medication. Resume full activity as the soreness in your incision resolves.  Bathing/Showering  You may shower after you go home. Keep your incision dry for 48 hours. Do not soak in a bathtub, hot tub, or swim until the incision heals completely. You may not shower if you have a hemodialysis catheter.  Incision Care  Clean your incision with mild soap and water after 48 hours. Pat the area dry with a clean towel. You do not need a bandage unless otherwise instructed. Do not apply any ointments or creams to your incision. You may have skin glue on your incision. Do not peel it off. It will come off on its own in about one week. Your arm may swell a bit after surgery. To reduce swelling use pillows to elevate your arm so it is above your heart. Your doctor will tell you if you need to lightly wrap your arm with an ACE bandage.  Diet  Resume your normal diet. There are not special food restrictions following this procedure. In order to heal from your surgery, it is CRITICAL to get adequate nutrition. Your body requires vitamins, minerals, and protein. Vegetables are the best source of vitamins and minerals. Vegetables also provide the perfect balance of protein. Processed food has little nutritional value, so try to avoid this.  Medications  Resume taking all of your medications. If your incision is causing pain, you may take over-the counter pain relievers such as acetaminophen (Tylenol). If you were prescribed a stronger pain medication, please be aware these medications can cause nausea and constipation. Prevent  nausea by taking the medication with a snack or meal. Avoid constipation by drinking plenty of fluids and eating foods with high amount of fiber, such as fruits, vegetables, and grains.  Do not take Tylenol if you are taking prescription pain medications.  Follow up Your surgeon may want to see you in the office following your access surgery. If so, this will be arranged at the time of your surgery.  Please call us immediately for any of the following conditions:  Increased pain, redness, drainage (pus) from your incision site Fever of 101 degrees or higher Severe or worsening pain at your incision site Hand pain or numbness.  Reduce your risk of vascular disease:  Stop smoking. If you would like help, call [facility] at 1-800-QUIT-NOW 681-883-6349) or [place] at [place] your cholesterol Maintain a desired weight Control your diabetes Keep your blood pressure down  Dialysis  It will take several weeks to several months for your new dialysis access to be ready for use. Your surgeon will determine when it is okay to use it. Your nephrologist will continue to direct your dialysis. You can continue to use your Permcath until your new access is ready for use.   11/11/2018 Kevin Walker [id number] 1950-07-26  Surgeon(s): Early, Arvilla Meres, MD  Procedure(s): SECOND STAGE BASILIC VEIN TRANSPOSITION LEFT ARM  x Do not stick fistula for 6 weeks    If you have any questions, please call the office at [phone].

## 2018-11-11 NOTE — Anesthesia Postprocedure Evaluation (Signed)
Anesthesia Post Note  Patient: [name]  Procedure(s) Performed: SECOND STAGE BASILIC VEIN TRANSPOSITION LEFT ARM (Left Arm Upper)     Patient location during evaluation: PACU Anesthesia Type: MAC Level of consciousness: awake and alert Pain management: pain level controlled Vital Signs Assessment: post-procedure vital signs reviewed and stable Respiratory status: spontaneous breathing, nonlabored ventilation, respiratory function stable and patient connected to nasal cannula oxygen Cardiovascular status: stable and blood pressure returned to baseline Postop Assessment: no apparent nausea or vomiting Anesthetic complications: no    Last Vitals:  Vitals:   11/11/18 1215 11/11/18 1220  BP:  (!) 171/76  Pulse:  (!) 57  Resp: 11 13  Temp:  (!) 36.3 C  SpO2:  93%    Last Pain:  Vitals:   11/11/18 1220  TempSrc:   PainSc: 3                  Rue Tinnel S

## 2018-11-11 NOTE — Anesthesia Procedure Notes (Signed)
Procedure Name: LMA Insertion Date/Time: 11/11/2018 9:12 AM Performed by: Lowella Dell, CRNA Pre-anesthesia Checklist: Patient identified, Emergency Drugs available, Suction available and Patient being monitored Patient Re-evaluated:Patient Re-evaluated prior to induction Oxygen Delivery Method: Circle System Utilized Preoxygenation: Pre-oxygenation with 100% oxygen Induction Type: IV induction Ventilation: Mask ventilation without difficulty LMA: LMA inserted LMA Size: 5.0 Number of attempts: 2 Placement Confirmation: positive ETCO2 Tube secured with: Tape Dental Injury: Teeth and Oropharynx as per pre-operative assessment

## 2018-11-11 NOTE — Op Note (Signed)
    OPERATIVE REPORT  DATE OF SURGERY: 11/11/2018  PATIENT: Kevin Walker, 68 y.o. male MRN: [id number]  DOB: August 10, 1950  PRE-OPERATIVE DIAGNOSIS: End-stage renal disease  POST-OPERATIVE DIAGNOSIS:  Same  PROCEDURE: Left second stage basilic vein transposition fistula  SURGEON:  Curt Jews, M.D.  PHYSICIAN ASSISTANT: Liana Crocker, PA-C  ANESTHESIA: LMA  EBL: per anesthesia record  Total I/O In: 500 [I.V.:500] Out: 30 [Blood:30]  BLOOD ADMINISTERED: none  DRAINS: none  SPECIMEN: none  COUNTS CORRECT:  YES  PATIENT DISPOSITION:  PACU - hemodynamically stable  PROCEDURE DETAILS: Patient was taken the operating placed supine position where the area of the left arm and left axilla were prepped and draped in usual sterile fashion.  Incision was made over the prior brachial artery to basilic vein anastomosis and carried down to isolate the brachial artery and basilic vein.  There was scarring from the prior mobilization.  The basilic vein was mobilized from the level of the antecubital space to the axilla.  The basilic vein joined the brachial vein mid upper arm.  Tributary branches were ligated with 3-0 silk ties and divided.  The vein was occluded near the prior brachial artery anastomosis and the vein was transected.  The vein was brought out of the tunnel and had been marked to reduce risk of twisting.  A the vein was gently dilated with heparinized saline was excellent size.  There was a segment of sclerotic vein on the proximal several centimeters.  A 4 dilator passed with some resistance through this but was widely patent above this.  A tunnel was created from the antecubital space to the axilla and the vein was brought back through the tunnel.  The sclerotic portion of the vein was transected and removed.  The brachial artery was occluded proximal distal to the old anastomosis.  The old anastomosis was completely taken down.  The vein was spatulated and sewn end-to-side to the  brachial artery with a running 6-0 Prolene suture.  Clamps were removed and excellent thrill was noted.  The patient maintained 2+ radial pulse.  Wounds were irrigated with saline.  Hemostasis to electrocautery.  The wounds were closed with 3-0 Vicryl in the subcutaneous and subcuticular tissue.  Sterile dressing was applied and the patient was transferred to the recovery room in stable condition   Rosetta Posner, M.D., [facility] 11/11/2018 12:22 PM

## 2018-11-11 NOTE — Transfer of Care (Signed)
Immediate Anesthesia Transfer of Care Note  Patient: Kevin Walker  Procedure(s) Performed: SECOND STAGE BASILIC VEIN TRANSPOSITION LEFT ARM (Left Arm Upper)  Patient Location: PACU  Anesthesia Type:General  Level of Consciousness: drowsy and patient cooperative  Airway & Oxygen Therapy: Patient Spontanous Breathing and Patient connected to nasal cannula oxygen  Post-op Assessment: Report given to RN, Post -op Vital signs reviewed and stable and Patient moving all extremities  Post vital signs: Reviewed and stable  Last Vitals:  Vitals Value Taken Time  BP 176/83 11/11/18 1151  Temp    Pulse 62 11/11/18 1152  Resp 12 11/11/18 1152  SpO2 100 % 11/11/18 1152  Vitals shown include unvalidated device data.  Last Pain:  Vitals:   11/11/18 0754  TempSrc:   PainSc: 0-No pain         Complications: No apparent anesthesia complications

## 2018-11-12 ENCOUNTER — Encounter (HOSPITAL_COMMUNITY): Payer: Self-pay | Admitting: Vascular Surgery

## 2018-11-12 LAB — POCT I-STAT 4, (NA,K, GLUC, HGB,HCT)
Glucose, Bld: 88 mg/dL (ref 70–99)
HCT: 28 % — ABNORMAL LOW (ref 39.0–52.0)
Hemoglobin: 9.5 g/dL — ABNORMAL LOW (ref 13.0–17.0)
Potassium: 4.1 mmol/L (ref 3.5–5.1)
Sodium: 132 mmol/L — ABNORMAL LOW (ref 135–145)

## 2018-12-09 ENCOUNTER — Telehealth (HOSPITAL_COMMUNITY): Payer: Self-pay | Admitting: Rehabilitation

## 2018-12-09 NOTE — Telephone Encounter (Signed)

## 2018-12-10 ENCOUNTER — Other Ambulatory Visit: Payer: Self-pay

## 2018-12-10 ENCOUNTER — Ambulatory Visit (INDEPENDENT_AMBULATORY_CARE_PROVIDER_SITE_OTHER): Payer: Self-pay | Admitting: Vascular Surgery

## 2018-12-10 ENCOUNTER — Encounter: Payer: Self-pay | Admitting: Vascular Surgery

## 2018-12-10 VITALS — BP 133/71 | HR 60 | Temp 97.4°F | Resp 20 | Ht 72.0 in | Wt 184.0 lb

## 2018-12-10 DIAGNOSIS — N186 End stage renal disease: Secondary | ICD-10-CM

## 2018-12-10 DIAGNOSIS — Z992 Dependence on renal dialysis: Secondary | ICD-10-CM

## 2018-12-10 NOTE — Progress Notes (Signed)
   Patient name: Kevin Walker MRN: [id number] DOB: 1950-05-12 Sex: male  REASON FOR VISIT: Follow-up left basilic vein transposition fistula  HPI: Kevin Walker is a 68 y.o. male here today for follow-up.  Is having good dialysis via his tunneled catheter without difficulty.  He had left for stage basilic vein fistula on 10/22/8370 and subsequently underwent transposition on 11/11/2018.  He is here today for follow-up.  He has excellent healing.  He has no steal symptoms  Current Outpatient Medications  Medication Sig Dispense Refill  . acetaminophen (TYLENOL) 500 MG tablet Take 1,000 mg by mouth every 6 (six) hours as needed for moderate pain.    [name] amLODipine (NORVASC) 10 MG tablet Take 10 mg by mouth daily.    [name] atenolol (TENORMIN) 50 MG tablet Take 50 mg by mouth daily.    . B Complex-C-Folic Acid (DIALYVITE PO) Take 1 tablet by mouth daily.    . bisacodyl (DULCOLAX) 5 MG EC tablet Take 5 mg by mouth daily.    [name] bismuth subsalicylate (PEPTO BISMOL) 262 MG/15ML suspension Take 30 mLs by mouth every 6 (six) hours as needed for indigestion.    . calcitRIOL (ROCALTROL) 0.25 MCG capsule Take 0.25 mcg by mouth daily.    . ferrous sulfate 325 (65 FE) MG tablet Take 325 mg by mouth daily.     . furosemide (LASIX) 80 MG tablet Take 80 mg by mouth daily.     [name] latanoprost (XALATAN) 0.005 % ophthalmic solution Place 1 drop into both eyes at bedtime.    . lidocaine-prilocaine (EMLA) cream Apply 1 application topically as needed (for access).    [name] omeprazole (PRILOSEC) 20 MG capsule Take 20 mg by mouth 2 (two) times daily as needed (acid reflux/indigestion.).     [name] Polyvinyl Alcohol-Povidone (REFRESH OP) Place 1 drop into both eyes 2 (two) times a day.     . sevelamer carbonate (RENVELA) 800 MG tablet Take 800 mg by mouth 3 (three) times daily with meals.     . traZODone (DESYREL) 100 MG tablet Take 100 mg by mouth at bedtime.      No current facility-administered  medications for this visit.      PHYSICAL EXAM: Vitals:   12/10/18 1519  BP: 133/71  Pulse: 60  Resp: 20  Temp: (!) 97.4 F (36.3 C)  SpO2: 97%  Weight: 184 lb (83.5 kg)  Height: 6' (1.829 m)    GENERAL: The patient is a well-nourished male, in no acute distress. The vital signs are documented above. All surgical incisions are well-healed.  He has excellent maturation of his fistula with a very superficial location.  MEDICAL ISSUES: He is now 1 month out from second stage mobilization.  Over 3 months out from initial fistula creation.  I feel that he should be safe to begin use of his left arm fistula in 2 weeks.  He can then have his catheter removed after successful dialysis sessions.   Rosetta Posner, MD FACS [facility] Tel [phone] Pager [phone]

## 2019-01-09 ENCOUNTER — Encounter: Payer: Self-pay | Admitting: *Deleted

## 2019-01-09 ENCOUNTER — Other Ambulatory Visit: Payer: Self-pay | Admitting: *Deleted

## 2019-01-09 NOTE — Progress Notes (Signed)
Call to [facility] and pre-procedure instruction letter faxed for patient. TDC removal on 01/22/2019 and nasal swab 01/20/2019. [place] at Short Stay reserved room.

## 2019-01-20 ENCOUNTER — Other Ambulatory Visit (HOSPITAL_COMMUNITY)
Admission: RE | Admit: 2019-01-20 | Discharge: 2019-01-20 | Disposition: A | Discharge status: Home / Self Care | Payer: Medicare Other | Source: Ambulatory Visit | Attending: Surgery | Admitting: Surgery

## 2019-01-20 ENCOUNTER — Other Ambulatory Visit: Payer: Self-pay

## 2019-01-20 DIAGNOSIS — Z20828 Contact with and (suspected) exposure to other viral communicable diseases: Secondary | ICD-10-CM | POA: Insufficient documentation

## 2019-01-20 DIAGNOSIS — Z01812 Encounter for preprocedural laboratory examination: Secondary | ICD-10-CM | POA: Diagnosis not present

## 2019-01-20 LAB — SARS CORONAVIRUS 2 (TAT 6-24 HRS): SARS Coronavirus 2: NEGATIVE

## 2019-01-22 ENCOUNTER — Ambulatory Visit (HOSPITAL_COMMUNITY)
Admission: RE | Admit: 2019-01-22 | Discharge: 2019-01-22 | Disposition: A | Discharge status: Home / Self Care | Payer: No Typology Code available for payment source | Source: Ambulatory Visit | Attending: Surgery | Admitting: Surgery

## 2019-01-22 ENCOUNTER — Other Ambulatory Visit: Payer: Self-pay

## 2019-01-22 DIAGNOSIS — I509 Heart failure, unspecified: Secondary | ICD-10-CM | POA: Diagnosis not present

## 2019-01-22 DIAGNOSIS — Z79899 Other long term (current) drug therapy: Secondary | ICD-10-CM | POA: Insufficient documentation

## 2019-01-22 DIAGNOSIS — Z4901 Encounter for fitting and adjustment of extracorporeal dialysis catheter: Secondary | ICD-10-CM | POA: Insufficient documentation

## 2019-01-22 DIAGNOSIS — N186 End stage renal disease: Secondary | ICD-10-CM | POA: Diagnosis not present

## 2019-01-22 DIAGNOSIS — Z7901 Long term (current) use of anticoagulants: Secondary | ICD-10-CM | POA: Insufficient documentation

## 2019-01-22 DIAGNOSIS — K219 Gastro-esophageal reflux disease without esophagitis: Secondary | ICD-10-CM | POA: Diagnosis not present

## 2019-01-22 DIAGNOSIS — G4730 Sleep apnea, unspecified: Secondary | ICD-10-CM | POA: Diagnosis not present

## 2019-01-22 DIAGNOSIS — D649 Anemia, unspecified: Secondary | ICD-10-CM | POA: Insufficient documentation

## 2019-01-22 DIAGNOSIS — I132 Hypertensive heart and chronic kidney disease with heart failure and with stage 5 chronic kidney disease, or end stage renal disease: Secondary | ICD-10-CM | POA: Insufficient documentation

## 2019-01-22 DIAGNOSIS — F4310 Post-traumatic stress disorder, unspecified: Secondary | ICD-10-CM | POA: Insufficient documentation

## 2019-01-22 MED ORDER — LIDOCAINE HCL (PF) 1 % IJ SOLN
INTRAMUSCULAR | Status: AC
  Filled 2019-01-22: qty 2

## 2019-01-22 MED ORDER — LIDOCAINE HCL (PF) 2 % IJ SOLN
INTRAMUSCULAR | Status: AC
  Filled 2019-01-22: qty 10

## 2019-01-22 NOTE — Progress Notes (Signed)
Patient s/p Cath removal .  Dressing to right chest is CDI.

## 2019-01-22 NOTE — Progress Notes (Signed)
  Catheter Removal Procedure Note    Diagnosis: ESRD  Plan:  Remove right diatek catheter  Consent signed:  Yes.   Time out completed:  Yes.   Coumadin:  No. PT/INR (if applicable):   Other labs:  Procedure: 1.  Sterile prepping and draping over catheter area 2. 9 ml 2% lidocaine plain instilled at removal site. 3.  right catheter removed in its entirety with cuff in tact. 4.  Complications:  none 5. Tip of catheter sent for culture:  No.   Patient tolerated procedure well:  Yes.   Pressure held, no bleeding noted, dressing applied Instructions given to the pt regarding wound care and bleeding.   Leontine Locket, PA-C 01/22/2019 11:59 AM [phone]

## 2019-01-22 NOTE — Progress Notes (Addendum)
H&P    CC:  Catheter removal   HPI:  This is a 68 y.o. male in need of tunneled dialysis catheter removal.  He had a left 2nd stage BVT on 11/11/2018 by Dr. Donnetta Hutching.   The first stage was on 08/28/2018.  He had a right TDC placed on 06/28/2018 by Dr. Oneida Alar.  He is here today for catheter removal.   He states that his fistula is working well.  He dialyzes in [place] and is a pt of Dr. Lowanda Foster, who just retired.   Past Medical History:  Diagnosis Date  . Anemia    low iron  . CHF (congestive heart failure) ([place])   . Chronic kidney disease    Dialysis T/Th/Sa  . Dyspnea    on exertion  . GERD (gastroesophageal reflux disease)   . Heart murmur   . Hernia, abdominal   . History of kidney stones   . Hypertension   . PTSD (post-traumatic stress disorder)   . Sleep apnea    has a cpap - has not used it in past, but starting soon    FH:  Non-Contributory  Social History   Socioeconomic History  . Marital status: Married    Spouse name: Not on file  . Number of children: Not on file  . Years of education: Not on file  . Highest education level: Not on file  Occupational History  . Not on file  Social Needs  . Financial resource strain: Not on file  . Food insecurity    Worry: Not on file    Inability: Not on file  . Transportation needs    Medical: Not on file    Non-medical: Not on file  Tobacco Use  . Smoking status: Never Smoker  . Smokeless tobacco: Never Used  Substance and Sexual Activity  . Alcohol use: No    Frequency: Never    Comment: years ago, none now  . Drug use: No    Comment: years ago  . Sexual activity: Not on file  Lifestyle  . Physical activity    Days per week: Not on file    Minutes per session: Not on file  . Stress: Not on file  Relationships  . Social Herbalist on phone: Not on file    Gets together: Not on file    Attends religious service: Not on file    Active member of club or organization: Not on file    Attends meetings  of clubs or organizations: Not on file    Relationship status: Not on file  . Intimate partner violence    Fear of current or ex partner: Not on file    Emotionally abused: Not on file    Physically abused: Not on file    Forced sexual activity: Not on file  Other Topics Concern  . Not on file  Social History Narrative  . Not on file    No Known Allergies  Current Outpatient Medications  Medication Sig Dispense Refill  . acetaminophen (TYLENOL) 500 MG tablet Take 1,000 mg by mouth every 6 (six) hours as needed for moderate pain.    [name] amLODipine (NORVASC) 10 MG tablet Take 10 mg by mouth daily.    [name] atenolol (TENORMIN) 50 MG tablet Take 50 mg by mouth daily.    . B Complex-C-Folic Acid (DIALYVITE PO) Take 1 tablet by mouth daily.    . bisacodyl (DULCOLAX) 5 MG EC tablet Take 5 mg by mouth  daily.    . bismuth subsalicylate (PEPTO BISMOL) 262 MG/15ML suspension Take 30 mLs by mouth every 6 (six) hours as needed for indigestion.    . calcitRIOL (ROCALTROL) 0.25 MCG capsule Take 0.25 mcg by mouth daily.    . ferrous sulfate 325 (65 FE) MG tablet Take 325 mg by mouth daily.     . furosemide (LASIX) 80 MG tablet Take 80 mg by mouth daily.     [name] latanoprost (XALATAN) 0.005 % ophthalmic solution Place 1 drop into both eyes at bedtime.    . lidocaine-prilocaine (EMLA) cream Apply 1 application topically as needed (for access).    [name] omeprazole (PRILOSEC) 20 MG capsule Take 20 mg by mouth 2 (two) times daily as needed (acid reflux/indigestion.).     [name] Polyvinyl Alcohol-Povidone (REFRESH OP) Place 1 drop into both eyes 2 (two) times a day.     . sevelamer carbonate (RENVELA) 800 MG tablet Take 800 mg by mouth 3 (three) times daily with meals.     . traZODone (DESYREL) 100 MG tablet Take 100 mg by mouth at bedtime.      No current facility-administered medications for this encounter.     ROS:  See HPI  PHYSICAL EXAM  Today's Vitals   01/22/19 1047  BP: (!) 126/48  Pulse: 61  Resp: 18   Temp: (!) 97.3 F (36.3 C)  TempSrc: Temporal  SpO2: 99%  Weight: 83.5 kg  Height: 6' (1.829 m)   Body mass index is 24.95 kg/m.  Gen:  Well developed well nourished HEENT:  normocephalic Neck:  Right TDC in place Heart:  regular Lungs:  Non-labored Abdomen:  midline hernia present Extremities:  Excellent thrill in the RUE AVF Skin:  No obvious rashes Neuro:  In tact  Lab:  n/a  Impression: This is a 68 y.o. male here for diatek catheter removal  Plan:  Removal of right diatek catheter  Leontine Locket, PA-C [facility] [phone] 01/22/2019 10:50 AM

## 2019-02-03 DIAGNOSIS — K439 Ventral hernia without obstruction or gangrene: Secondary | ICD-10-CM | POA: Diagnosis not present

## 2019-02-03 DIAGNOSIS — Z992 Dependence on renal dialysis: Secondary | ICD-10-CM | POA: Diagnosis not present

## 2019-02-03 DIAGNOSIS — N186 End stage renal disease: Secondary | ICD-10-CM | POA: Diagnosis not present

## 2019-04-11 NOTE — Progress Notes (Signed)
HISTORY AND PHYSICAL     CC:  dialysis access Requesting Provider:  Cl[facility], [facility]  HPI: This is a 68 y.o. male who had a left 2nd stage BVT on 11/11/2018 by Dr. Donnetta Hutching.   The first stage was on 08/28/2018.  He had a right TDC placed on 06/28/2018 by Dr. Oneida Alar.  He is here today for catheter removal.   He states that his fistula is working well.  He dialyzes in [place] and is a pt of Dr. Lowanda Foster, who just retired.  He had his TDC removed on 01/22/2019.  He presents today for hx of swelling in his left arm/hand from about the elbow down.  It has been present for about 2 weeks.  He states that a couple of weeks ago, the dialysis tech was manipulating the needle, then stuck it straight down into the fistula.  The next morning, he awoke with his hand and distal arm swollen.  He states that his fistula is working well.    He has hx of right RC AVF.    If pt on Dialysis:   Dialysis days/center:  T/T/S in [place].  The pt is not on a statin for cholesterol management.  The pt is not diabetic.   The pt is on CCB, BB for hypertension.   Tobacco hx:  never The pt is not on a daily aspirin. Other AC:  none  Past Medical History:  Diagnosis Date  . Anemia    low iron  . CHF (congestive heart failure) ([place])   . Chronic kidney disease    Dialysis T/Th/Sa  . Dyspnea    on exertion  . GERD (gastroesophageal reflux disease)   . Heart murmur   . Hernia, abdominal   . History of kidney stones   . Hypertension   . PTSD (post-traumatic stress disorder)   . Sleep apnea    has a cpap - has not used it in past, but starting soon    Past Surgical History:  Procedure Laterality Date  . A/V FISTULAGRAM N/A 06/28/2018   Procedure: A/V FISTULAGRAM;  Surgeon: Elam Dutch, MD;  Location: [facility];  Service: Cardiovascular;  Laterality: N/A;  . AV FISTULA PLACEMENT Right   . AV FISTULA PLACEMENT Left    this one never used  . AV FISTULA PLACEMENT Left 08/28/2018   Procedure:  Arteriovenous (Av) Fistula Creation;  Surgeon: Rosetta Posner, MD;  Location: [facility];  Service: Vascular;  Laterality: Left;  . BASCILIC VEIN TRANSPOSITION Left 11/11/2018   Procedure: SECOND STAGE BASILIC VEIN TRANSPOSITION LEFT ARM;  Surgeon: Rosetta Posner, MD;  Location: [facility];  Service: Vascular;  Laterality: Left;  . CARDIAC CATHETERIZATION     performed at the [facility] in [place]  . COLONOSCOPY    . FISTULOGRAM Right 06/11/2017   Procedure: FISTULOGRAM RIGHT ARTERIOVENOUS FISTULA WITH INTERVENTION;  Surgeon: Conrad Earlton, MD;  Location: [facility];  Service: Vascular;  Laterality: Right;  . INSERTION OF DIALYSIS CATHETER    . IR GENERIC HISTORICAL  12/02/2015   IR FLUORO GUIDE CV LINE LEFT 12/02/2015 Jacqulynn Cadet, MD [facility]  . IR GENERIC HISTORICAL  03/06/2016   IR REMOVAL TUN CV CATH W/O FL 03/06/2016 Markus Daft, MD [facility]  . TEMPORARY DIALYSIS CATHETER  06/28/2018   Procedure: TEMPORARY DIALYSIS CATHETER;  Surgeon: Elam Dutch, MD;  Location: [facility];  Service: Cardiovascular;;  Right Subclavian Dialysis catheter insertion    No Known Allergies  Current Outpatient Medications  Medication Sig Dispense Refill  . acetaminophen (TYLENOL) 500 MG tablet Take 1,000 mg by mouth every 6 (six) hours as needed for moderate pain.    [name] amLODipine (NORVASC) 10 MG tablet Take 10 mg by mouth daily.    [name] atenolol (TENORMIN) 50 MG tablet Take 50 mg by mouth daily.    . B Complex-C-Folic Acid (DIALYVITE PO) Take 1 tablet by mouth daily.    . bisacodyl (DULCOLAX) 5 MG EC tablet Take 5 mg by mouth daily.    [name] bismuth subsalicylate (PEPTO BISMOL) 262 MG/15ML suspension Take 30 mLs by mouth every 6 (six) hours as needed for indigestion.    . calcitRIOL (ROCALTROL) 0.25 MCG capsule Take 0.25 mcg by mouth daily.    . ferrous sulfate 325 (65 FE) MG tablet Take 325 mg by mouth daily.     . furosemide (LASIX) 80 MG tablet Take 80 mg by mouth daily.     [name] latanoprost (XALATAN) 0.005 %  ophthalmic solution Place 1 drop into both eyes at bedtime.    . lidocaine-prilocaine (EMLA) cream Apply 1 application topically as needed (for access).    [name] omeprazole (PRILOSEC) 20 MG capsule Take 20 mg by mouth 2 (two) times daily as needed (acid reflux/indigestion.).     [name] Polyvinyl Alcohol-Povidone (REFRESH OP) Place 1 drop into both eyes 2 (two) times a day.     . sevelamer carbonate (RENVELA) 800 MG tablet Take 800 mg by mouth 3 (three) times daily with meals.     . traZODone (DESYREL) 100 MG tablet Take 100 mg by mouth at bedtime.      No current facility-administered medications for this visit.     Family History  Problem Relation Age of Onset  . Diabetes Mother   . Heart disease Mother   . Heart disease Father     Social History   Socioeconomic History  . Marital status: Married    Spouse name: Not on file  . Number of children: Not on file  . Years of education: Not on file  . Highest education level: Not on file  Occupational History  . Not on file  Social Needs  . Financial resource strain: Not on file  . Food insecurity    Worry: Not on file    Inability: Not on file  . Transportation needs    Medical: Not on file    Non-medical: Not on file  Tobacco Use  . Smoking status: Never Smoker  . Smokeless tobacco: Never Used  Substance and Sexual Activity  . Alcohol use: No    Frequency: Never    Comment: years ago, none now  . Drug use: No    Comment: years ago  . Sexual activity: Not on file  Lifestyle  . Physical activity    Days per week: Not on file    Minutes per session: Not on file  . Stress: Not on file  Relationships  . Social Herbalist on phone: Not on file    Gets together: Not on file    Attends religious service: Not on file    Active member of club or organization: Not on file    Attends meetings of clubs or organizations: Not on file    Relationship status: Not on file  . Intimate partner violence    Fear of current or ex  partner: Not on file    Emotionally abused: Not on file    Physically  abused: Not on file    Forced sexual activity: Not on file  Other Topics Concern  . Not on file  Social History Narrative  . Not on file     ROS: [x]  Positive   [ ]  Negative   [ ]  All sytems reviewed and are negative  Cardiac: []  chest pain/pressure []  SOB [x]  DOE  Vascular: []  pain in legs while walking []  pain in feet when lying flat []  hx of DVT [x]  swelling in left arm  Pulmonary: []  asthma []  wheezing  Neurologic: []  weakness in []  arms []  legs []  numbness in []  arms []  legs [] difficulty speaking or slurred speech []  temporary loss of vision in one eye []  dizziness  Hematologic: []  bleeding problems  GI []  GERD  GU: [x]  CKD/renal failure  [x]  HD---[x]  M/W/F []  T/T/S []  burning with urination []  blood in urine  Psychiatric: []  hx of major depression  Integumentary: []  rashes []  ulcers  Constitutional: []  fever []  chills  PHYSICAL EXAMINATION:  Today's Vitals   04/14/19 1400  BP: (!) 148/80  Pulse: 62  Resp: 16  Temp: 97.8 F (36.6 C)  TempSrc: Temporal  SpO2: 97%  Weight: 188 lb (85.3 kg)  Height: 5\' 11"  (1.803 m)   Body mass index is 26.22 kg/m.    General:  WDWN male in NAD Gait: Not observed HENT: WNL Pulmonary: normal non-labored breathing , without Rales, rhonchi,  wheezing Cardiac: regular Abdomen: soft, NT, no masses Skin: without rashes, without ulcers  Vascular Exam/Pulses:  + palpable left radial pulse; motor and sensation are in tact.  Extremities:  without ischemic changes, without Gangrene, without cellulitis; without open wounds; swelling left arm from about the elbow down with 2+ pitting edema left hand; the fistula has a good thrill Musculoskeletal: no muscle wasting or atrophy  Neurologic: A&O X 3; Moving all extremities equally;  Speech is fluent/normal  Non-Invasive Vascular Imaging:   Dialysis duplex 04/14/2019: Findings:  +--------------------+----------+-----------------+--------+ AVF                 PSV (cm/s)Flow Vol (mL/min)Comments +--------------------+----------+-----------------+--------+ Native artery inflow   242                              +--------------------+----------+-----------------+--------+ AVF Anastomosis        428          2190                +--------------------+----------+-----------------+--------+    +---------------+----------+-------------+----------+--------------------------+ OUTFLOW VEIN   PSV (cm/s)Diameter (cm)Depth (cm)         Describe          +---------------+----------+-------------+----------+--------------------------+ Subclavian vein                                          occluded          +---------------+----------+-------------+----------+--------------------------+ Prox UA           1.1        1.10                       aneurysmal         +---------------+----------+-------------+----------+--------------------------+ Mid UA            734        0.95  stenotic, aneurysmal and                                                        Retained valve       +---------------+----------+-------------+----------+--------------------------+ Dist UA           426        0.51                                          +---------------+----------+-------------+----------+--------------------------+ AC Fossa          462        0.72                                          +---------------+----------+-------------+----------+--------------------------+ Prox Forearm                                           Occluded AVF        +---------------+----------+-------------+----------+--------------------------+   Summary: Arteriovenous fistula-Velocities less than 100cm/s noted in the proximal upper arm.. Arteriovenous fistula-Elevated velocities noted possibly retained valve in mid upper arm.  Arteriovenous fistula-Thrombus noted in subclavian vein, however, this was difficult to visualize. Arteriovenous fistula-Aneurysmal dilatation noted. Arteriovenous fistula-Stenosis in addition to retained valve.    ASSESSMENT/PLAN: 68 y.o. male with ESRD here for evaluation of his left arm hemodialysis access  -pt had swelling in his distal arm after dialysis about 2 weeks ago.  He does have 2+ pitting edema in his left hand.  Dialysis duplex shows elevated velocities.  The fistula does have a thrill.  The duplex suggests there is an occlusion.  Discussed with Dr. Trula Slade and he feels this since this is subacute, the area may be able to be crossed and intervention done.  Will set up for fistulogram on a M/W/F.  -I did wrap his lower arm today to help with the swelling.  Also instructed him to elevate his arm to help with swelling    Leontine Locket, PA-C [facility] [phone]  Clinic MD:   Pt seen and examined with Dr. Trula Slade

## 2019-04-14 ENCOUNTER — Other Ambulatory Visit (HOSPITAL_COMMUNITY): Payer: Self-pay | Admitting: Physician Assistant

## 2019-04-14 ENCOUNTER — Other Ambulatory Visit: Payer: Self-pay

## 2019-04-14 ENCOUNTER — Ambulatory Visit (HOSPITAL_COMMUNITY)
Admission: RE | Admit: 2019-04-14 | Discharge: 2019-04-14 | Disposition: A | Discharge status: Home / Self Care | Payer: Medicare Other | Source: Ambulatory Visit | Attending: Family | Admitting: Family

## 2019-04-14 ENCOUNTER — Ambulatory Visit (INDEPENDENT_AMBULATORY_CARE_PROVIDER_SITE_OTHER): Payer: Self-pay | Admitting: Physician Assistant

## 2019-04-14 VITALS — BP 148/80 | HR 62 | Temp 97.8°F | Resp 16 | Ht 71.0 in | Wt 188.0 lb

## 2019-04-14 DIAGNOSIS — N186 End stage renal disease: Secondary | ICD-10-CM

## 2019-04-14 DIAGNOSIS — Z992 Dependence on renal dialysis: Secondary | ICD-10-CM

## 2019-04-14 DIAGNOSIS — M7989 Other specified soft tissue disorders: Secondary | ICD-10-CM | POA: Diagnosis not present

## 2019-04-15 ENCOUNTER — Other Ambulatory Visit: Payer: Self-pay

## 2019-04-21 ENCOUNTER — Other Ambulatory Visit (HOSPITAL_COMMUNITY)
Admission: RE | Admit: 2019-04-21 | Discharge: 2019-04-21 | Disposition: A | Discharge status: Home / Self Care | Payer: Medicare Other | Source: Ambulatory Visit | Attending: Vascular Surgery | Admitting: Vascular Surgery

## 2019-04-21 DIAGNOSIS — Z20828 Contact with and (suspected) exposure to other viral communicable diseases: Secondary | ICD-10-CM | POA: Insufficient documentation

## 2019-04-21 DIAGNOSIS — Z01812 Encounter for preprocedural laboratory examination: Secondary | ICD-10-CM | POA: Diagnosis present

## 2019-04-22 ENCOUNTER — Other Ambulatory Visit (HOSPITAL_COMMUNITY)
Admission: RE | Admit: 2019-04-22 | Discharge: 2019-04-22 | Disposition: A | Discharge status: Home / Self Care | Payer: Medicare Other | Source: Ambulatory Visit | Attending: Vascular Surgery | Admitting: Vascular Surgery

## 2019-04-23 ENCOUNTER — Ambulatory Visit (HOSPITAL_COMMUNITY)
Admission: RE | Admit: 2019-04-23 | Discharge: 2019-04-23 | Disposition: A | Discharge status: Home / Self Care | Payer: No Typology Code available for payment source | Attending: Vascular Surgery | Admitting: Vascular Surgery

## 2019-04-23 ENCOUNTER — Other Ambulatory Visit: Payer: Self-pay

## 2019-04-23 ENCOUNTER — Encounter (HOSPITAL_COMMUNITY)
Admission: RE | Disposition: A | Discharge status: Home / Self Care | Payer: Self-pay | Source: Home / Self Care | Attending: Vascular Surgery

## 2019-04-23 DIAGNOSIS — Z8249 Family history of ischemic heart disease and other diseases of the circulatory system: Secondary | ICD-10-CM | POA: Diagnosis not present

## 2019-04-23 DIAGNOSIS — G4730 Sleep apnea, unspecified: Secondary | ICD-10-CM | POA: Diagnosis not present

## 2019-04-23 DIAGNOSIS — T82898A Other specified complication of vascular prosthetic devices, implants and grafts, initial encounter: Secondary | ICD-10-CM

## 2019-04-23 DIAGNOSIS — Y832 Surgical operation with anastomosis, bypass or graft as the cause of abnormal reaction of the patient, or of later complication, without mention of misadventure at the time of the procedure: Secondary | ICD-10-CM | POA: Insufficient documentation

## 2019-04-23 DIAGNOSIS — I132 Hypertensive heart and chronic kidney disease with heart failure and with stage 5 chronic kidney disease, or end stage renal disease: Secondary | ICD-10-CM | POA: Diagnosis not present

## 2019-04-23 DIAGNOSIS — Z79899 Other long term (current) drug therapy: Secondary | ICD-10-CM | POA: Insufficient documentation

## 2019-04-23 DIAGNOSIS — N186 End stage renal disease: Secondary | ICD-10-CM | POA: Insufficient documentation

## 2019-04-23 DIAGNOSIS — K219 Gastro-esophageal reflux disease without esophagitis: Secondary | ICD-10-CM | POA: Insufficient documentation

## 2019-04-23 DIAGNOSIS — Z992 Dependence on renal dialysis: Secondary | ICD-10-CM | POA: Insufficient documentation

## 2019-04-23 DIAGNOSIS — I509 Heart failure, unspecified: Secondary | ICD-10-CM | POA: Diagnosis not present

## 2019-04-23 DIAGNOSIS — T82858A Stenosis of vascular prosthetic devices, implants and grafts, initial encounter: Secondary | ICD-10-CM | POA: Diagnosis present

## 2019-04-23 HISTORY — PX: A/V FISTULAGRAM: CATH118298

## 2019-04-23 HISTORY — PX: PERIPHERAL VASCULAR BALLOON ANGIOPLASTY: CATH118281

## 2019-04-23 LAB — POCT I-STAT, CHEM 8
BUN: 35 mg/dL — ABNORMAL HIGH (ref 8–23)
Calcium, Ion: 1.11 mmol/L — ABNORMAL LOW (ref 1.15–1.40)
Chloride: 97 mmol/L — ABNORMAL LOW (ref 98–111)
Creatinine, Ser: 6.6 mg/dL — ABNORMAL HIGH (ref 0.61–1.24)
Glucose, Bld: 89 mg/dL (ref 70–99)
HCT: 48 % (ref 39.0–52.0)
Hemoglobin: 16.3 g/dL (ref 13.0–17.0)
Potassium: 4.4 mmol/L (ref 3.5–5.1)
Sodium: 137 mmol/L (ref 135–145)
TCO2: 32 mmol/L (ref 22–32)

## 2019-04-23 LAB — SARS CORONAVIRUS 2 (TAT 6-24 HRS): SARS Coronavirus 2: NEGATIVE

## 2019-04-23 SURGERY — Surgical Case
Anesthesia: *Unknown

## 2019-04-23 SURGERY — A/V FISTULAGRAM
Anesthesia: LOCAL | Laterality: Left

## 2019-04-23 MED ORDER — HEPARIN SODIUM (PORCINE) 1000 UNIT/ML IJ SOLN
INTRAMUSCULAR | Status: DC | PRN
  Administered 2019-04-23: 3000 [IU] via INTRAVENOUS

## 2019-04-23 MED ORDER — HEPARIN (PORCINE) IN NACL 1000-0.9 UT/500ML-% IV SOLN
INTRAVENOUS | Status: AC
  Filled 2019-04-23: qty 500

## 2019-04-23 MED ORDER — HEPARIN SODIUM (PORCINE) 1000 UNIT/ML IJ SOLN
INTRAMUSCULAR | Status: AC
  Filled 2019-04-23: qty 1

## 2019-04-23 MED ORDER — SODIUM CHLORIDE 0.9% FLUSH: 3.0000 mL | INTRAVENOUS | Status: DC | PRN

## 2019-04-23 MED ORDER — IODIXANOL 320 MG/ML IV SOLN
INTRAVENOUS | Status: DC | PRN
  Administered 2019-04-23: 65 mL via INTRAVENOUS

## 2019-04-23 MED ORDER — SODIUM CHLORIDE 0.9 % IV SOLN: 250.0000 mL | INTRAVENOUS | Status: DC | PRN

## 2019-04-23 MED ORDER — LIDOCAINE HCL (PF) 1 % IJ SOLN
INTRAMUSCULAR | Status: AC
  Filled 2019-04-23: qty 30

## 2019-04-23 MED ORDER — SODIUM CHLORIDE 0.9% FLUSH: 3.0000 mL | Freq: Two times a day (BID) | INTRAVENOUS | Status: DC

## 2019-04-23 MED ORDER — LIDOCAINE HCL (PF) 1 % IJ SOLN
INTRAMUSCULAR | Status: DC | PRN
  Administered 2019-04-23: 2 mL via INTRADERMAL

## 2019-04-23 MED ORDER — HEPARIN (PORCINE) IN NACL 1000-0.9 UT/500ML-% IV SOLN
INTRAVENOUS | Status: DC | PRN
  Administered 2019-04-23: 500 mL

## 2019-04-23 SURGICAL SUPPLY — 19 items
BAG SNAP BAND KOVER 36X36 (MISCELLANEOUS) ×3 IMPLANT
BALLN LUTONIX AV 12X40X75 (BALLOONS) ×3
BALLN MUSTANG 10.0X40 75 (BALLOONS) ×3
BALLN MUSTANG 12.0X40 75 (BALLOONS) ×3
BALLOON LUTONIX AV 12X40X75 (BALLOONS) IMPLANT
BALLOON MUSTANG 10.0X40 75 (BALLOONS) IMPLANT
BALLOON MUSTANG 12.0X40 75 (BALLOONS) IMPLANT
CATH BEACON 5 .035 65 KMP TIP (CATHETERS) ×1 IMPLANT
COVER DOME SNAP 22 D (MISCELLANEOUS) ×3 IMPLANT
KIT ENCORE 26 ADVANTAGE (KITS) ×1 IMPLANT
KIT MICROPUNCTURE NIT STIFF (SHEATH) ×1 IMPLANT
PROTECTION STATION PRESSURIZED (MISCELLANEOUS) ×3
SHEATH PINNACLE R/O II 7F 4CM (SHEATH) ×1 IMPLANT
SHEATH PROBE COVER 6X72 (BAG) ×1 IMPLANT
STATION PROTECTION PRESSURIZED (MISCELLANEOUS) ×2 IMPLANT
STOPCOCK MORSE 400PSI 3WAY (MISCELLANEOUS) ×3 IMPLANT
TRAY PV CATH (CUSTOM PROCEDURE TRAY) ×3 IMPLANT
TUBING CIL FLEX 10 FLL-RA (TUBING) ×1 IMPLANT
WIRE BENTSON .035X145CM (WIRE) ×1 IMPLANT

## 2019-04-23 NOTE — Progress Notes (Signed)
Discharge instructions reviewed with patient and family. Verbalized understanding. 

## 2019-04-23 NOTE — Op Note (Signed)
OPERATIVE NOTE   PROCEDURE: 1. left brachiobasilic arteriovenous fistula cannulation under ultrasound guidance 2. left arm fistulogram including central venogram 3. left central venoplasty of innominate vein (10 mm x 40 mm Mustang, 12 mm x 40 mm Mustang, and 12 mm x 40 mm drug coated Lutonix)   PRE-OPERATIVE DIAGNOSIS: Malfunctioning left arteriovenous fistula with arm swelling  POST-OPERATIVE DIAGNOSIS: same as above   SURGEON: Marty Heck, MD  ANESTHESIA: local  ESTIMATED BLOOD LOSS: 5 cc  FINDING(S): 1. Patient had a near occlusive lesion of the left innominate vein.  Ultimately this was crossed and then angioplastied with a 10 mm and then 12 mm Mustang and then 12 mm drug-coated Lutonix.  There did not appear to be any residual stenosis.  Patient had an excellent thrill in the fistula.  SPECIMEN(S):  None  CONTRAST: 65 cc  INDICATIONS: Kevin Walker is a 68 y.o. male who  presents with malfunctioning left brachiobasilic arteriovenous fistula with arm swelling for several weeks.  The patient is scheduled for left arm fistulogram.  The patient is aware the risks include but are not limited to: bleeding, infection, thrombosis of the cannulated access, and possible anaphylactic reaction to the contrast.  The patient is aware of the risks of the procedure and elects to proceed forward.  DESCRIPTION: After full informed written consent was obtained, the patient was brought back to the angiography suite and placed supine upon the angiography table.  The patient was connected to monitoring equipment.  The left arm was prepped and draped in the standard fashion for a left arm fistulogram.  Under ultrasound guidance, the fistula was evaluated, it was patent, an image was saved.  The left arteriovenous fistula was cannulated with a micropuncture needle under ultrasound guidance.  The microwire was advanced into the fistula and the needle was exchanged for the a microsheath,  which was lodged 2 cm into the access.  The wire was removed and the sheath was connected to the IV extension tubing.  Hand injections were completed to image the access from the antecubitum up to the level of axilla.  The central venous structures were also imaged by hand injections.  Based on the images, this patient will need: central venous angioplasty at the innominate vein.  I then placed a Bentson wire and exchanged for a short 7 [place] sheath.  Patient was given 3000 units of IV heparin.  I then used a short KMP catheter and a Bentson wire to cross the innominate lesion and advance my wire up the right innominate vein for purchase.  I then selected a 10 mm x 40 mm Mustang inflated to nominal pressure for 2 minutes.  We shot an additional picture and there was some residual stenosis greater than 30%.  I then upsized to a 12 mm x 40 mm Mustang that was inflated to nominal pressure for 2 minutes and this looked much better after again treating the left innominate lesion.  Finally I treated this with a drug-coated balloon that was 12 mm x 40 mm and this was over inflated for 2 minutes.  There did not appear to be any residual stenosis.  There was an excellent thrill in the fistula.  A 4-0 Monocryl purse-string suture was sewn around the sheath.  The sheath was removed while tying down the suture.  A sterile bandage was applied to the puncture site.  He was taken to recovery in stable condition.  COMPLICATIONS: None  CONDITION: Stable  Marty Heck,  MD [facility] Office: [phone] Pager: [phone]  04/23/2019 9:09 AM

## 2019-04-23 NOTE — Discharge Instructions (Signed)

## 2019-04-23 NOTE — H&P (Signed)
History and Physical Interval Note:  04/23/2019 7:53 AM  Kevin Walker  has presented today for surgery, with the diagnosis of End stage renal.  The various methods of treatment have been discussed with the patient and family. After consideration of risks, benefits and other options for treatment, the patient has consented to  Procedure(s): A/V FISTULAGRAM (Left) as a surgical intervention.  The patient's history has been reviewed, patient examined, no change in status, stable for surgery.  I have reviewed the patient's chart and labs.  Questions were answered to the patient's satisfaction.    Left arm fistulogram.   Marty Heck  HISTORY AND PHYSICAL  CC: dialysis access  Requesting Provider: [facility], [facility]  HPI: This is a 68 y.o. male who had a left 2nd stage BVT on 11/11/2018 by Dr. Donnetta Hutching. The first stage was on 08/28/2018. He had a right TDC placed on 06/28/2018 by Dr. Oneida Alar. He is here today for catheter removal. He states that his fistula is working well. He dialyzes in [place] and is a pt of Dr. Lowanda Foster, who just retired. He had his TDC removed on 01/22/2019.  He presents today for hx of swelling in his left arm/hand from about the elbow down. It has been present for about 2 weeks. He states that a couple of weeks ago, the dialysis tech was manipulating the needle, then stuck it straight down into the fistula. The next morning, he awoke with his hand and distal arm swollen. He states that his fistula is working well.  He has hx of right RC AVF.  If pt on Dialysis:  Dialysis days/center: T/T/S in [place].  The pt is not on a statin for cholesterol management.  The pt is not diabetic.  The pt is on CCB, BB for hypertension.  Tobacco hx: never  The pt is not on a daily aspirin.  Other AC: none      Past Medical History:  Diagnosis Date  . Anemia    low iron  . CHF (congestive heart failure) ([place])   . Chronic kidney disease    Dialysis T/Th/Sa  . Dyspnea    on exertion   . GERD (gastroesophageal reflux disease)   . Heart murmur   . Hernia, abdominal   . History of kidney stones   . Hypertension   . PTSD (post-traumatic stress disorder)   . Sleep apnea    has a cpap - has not used it in past, but starting soon        Past Surgical History:  Procedure Laterality Date  . A/V FISTULAGRAM N/A 06/28/2018   Procedure: A/V FISTULAGRAM; Surgeon: Elam Dutch, MD; Location: [facility]; Service: Cardiovascular; Laterality: N/A;  . AV FISTULA PLACEMENT Right   . AV FISTULA PLACEMENT Left    this one never used  . AV FISTULA PLACEMENT Left 08/28/2018   Procedure: Arteriovenous (Av) Fistula Creation; Surgeon: Rosetta Posner, MD; Location: [facility]; Service: Vascular; Laterality: Left;  . BASCILIC VEIN TRANSPOSITION Left 11/11/2018   Procedure: SECOND STAGE BASILIC VEIN TRANSPOSITION LEFT ARM; Surgeon: Rosetta Posner, MD; Location: [facility]; Service: Vascular; Laterality: Left;  . CARDIAC CATHETERIZATION     performed at the [facility] in [place]  . COLONOSCOPY    . FISTULOGRAM Right 06/11/2017   Procedure: FISTULOGRAM RIGHT ARTERIOVENOUS FISTULA WITH INTERVENTION; Surgeon: Conrad [place] Ridge, MD; Location: [facility]; Service: Vascular; Laterality: Right;  . INSERTION OF DIALYSIS CATHETER    . IR GENERIC HISTORICAL  12/02/2015  IR FLUORO GUIDE CV LINE LEFT 12/02/2015 Jacqulynn Cadet, MD [facility]  . IR GENERIC HISTORICAL  03/06/2016   IR REMOVAL TUN CV CATH W/O FL 03/06/2016 Markus Daft, MD [facility]  . TEMPORARY DIALYSIS CATHETER  06/28/2018   Procedure: TEMPORARY DIALYSIS CATHETER; Surgeon: Elam Dutch, MD; Location: [facility]; Service: Cardiovascular;; Right Subclavian Dialysis catheter insertion   No Known Allergies        Current Outpatient Medications  Medication Sig Dispense Refill  . acetaminophen (TYLENOL) 500 MG tablet Take 1,000 mg by mouth every 6 (six) hours as needed for moderate pain.    [name] amLODipine (NORVASC) 10 MG tablet Take 10 mg  by mouth daily.    [name] atenolol (TENORMIN) 50 MG tablet Take 50 mg by mouth daily.    . B Complex-C-Folic Acid (DIALYVITE PO) Take 1 tablet by mouth daily.    . bisacodyl (DULCOLAX) 5 MG EC tablet Take 5 mg by mouth daily.    [name] bismuth subsalicylate (PEPTO BISMOL) 262 MG/15ML suspension Take 30 mLs by mouth every 6 (six) hours as needed for indigestion.    . calcitRIOL (ROCALTROL) 0.25 MCG capsule Take 0.25 mcg by mouth daily.    . ferrous sulfate 325 (65 FE) MG tablet Take 325 mg by mouth daily.     . furosemide (LASIX) 80 MG tablet Take 80 mg by mouth daily.     [name] latanoprost (XALATAN) 0.005 % ophthalmic solution Place 1 drop into both eyes at bedtime.    . lidocaine-prilocaine (EMLA) cream Apply 1 application topically as needed (for access).    [name] omeprazole (PRILOSEC) 20 MG capsule Take 20 mg by mouth 2 (two) times daily as needed (acid reflux/indigestion.).     [name] Polyvinyl Alcohol-Povidone (REFRESH OP) Place 1 drop into both eyes 2 (two) times a day.     . sevelamer carbonate (RENVELA) 800 MG tablet Take 800 mg by mouth 3 (three) times daily with meals.     . traZODone (DESYREL) 100 MG tablet Take 100 mg by mouth at bedtime.      No current facility-administered medications for this visit.         Family History  Problem Relation Age of Onset  . Diabetes Mother   . Heart disease Mother   . Heart disease Father    Social History        Socioeconomic History  . Marital status: Married    Spouse name: Not on file  . Number of children: Not on file  . Years of education: Not on file  . Highest education level: Not on file  Occupational History  . Not on file  Social Needs  . Financial resource strain: Not on file  . Food insecurity    Worry: Not on file    Inability: Not on file  . Transportation needs    Medical: Not on file    Non-medical: Not on file  Tobacco Use  . Smoking status: Never Smoker  . Smokeless tobacco: Never Used  Substance and Sexual Activity  . Alcohol  use: No    Frequency: Never    Comment: years ago, none now  . Drug use: No    Comment: years ago  . Sexual activity: Not on file  Lifestyle  . Physical activity    Days per week: Not on file    Minutes per session: Not on file  . Stress: Not on file  Relationships  . Social connections  Talks on phone: Not on file    Gets together: Not on file    Attends religious service: Not on file    Active member of club or organization: Not on file    Attends meetings of clubs or organizations: Not on file    Relationship status: Not on file  . Intimate partner violence    Fear of current or ex partner: Not on file    Emotionally abused: Not on file    Physically abused: Not on file    Forced sexual activity: Not on file  Other Topics Concern  . Not on file  Social History Narrative  . Not on file   ROS: [x]  Positive [ ]  Negative [ ]  All sytems reviewed and are negative  Cardiac:  []  chest pain/pressure  []  SOB  [x]  DOE  Vascular:  []  pain in legs while walking  []  pain in feet when lying flat  []  hx of DVT  [x]  swelling in left arm  Pulmonary:  []  asthma  []  wheezing  Neurologic:  []  weakness in []  arms []  legs  []  numbness in []  arms []  legs  [] difficulty speaking or slurred speech  []  temporary loss of vision in one eye  []  dizziness  Hematologic:  []  bleeding problems  GI  []  GERD  GU:  [x]  CKD/renal failure [x]  HD---[x]  M/W/F []  T/T/S  []  burning with urination  []  blood in urine  Psychiatric:  []  hx of major depression  Integumentary:  []  rashes []  ulcers  Constitutional:  []  fever []  chills  PHYSICAL EXAMINATION:     Today's Vitals   04/14/19 1400  BP: (!) 148/80  Pulse: 62  Resp: 16  Temp: 97.8 F (36.6 C)  TempSrc: Temporal  SpO2: 97%  Weight: 188 lb (85.3 kg)  Height: 5\' 11"  (1.803 m)   Body mass index is 26.22 kg/m.  General: WDWN male in NAD  Gait: Not observed  HENT: WNL  Pulmonary: normal non-labored breathing , without Rales,  rhonchi, wheezing  Cardiac: regular  Abdomen: soft, NT, no masses  Skin: without rashes, without ulcers  Vascular Exam/Pulses:  + palpable left radial pulse; motor and sensation are in tact.  Extremities: without ischemic changes, without Gangrene, without cellulitis; without open wounds; swelling left arm from about the elbow down with 2+ pitting edema left hand; the fistula has a good thrill  Musculoskeletal: no muscle wasting or atrophy  Neurologic: A&O X 3; Moving all extremities equally; Speech is fluent/normal  Non-Invasive Vascular Imaging:  Dialysis duplex 04/14/2019:  Findings:  +--------------------+----------+-----------------+--------+  AVF PSV (cm/s)Flow Vol (mL/min)Comments  +--------------------+----------+-----------------+--------+  Native artery inflow 242     +--------------------+----------+-----------------+--------+  AVF Anastomosis  428  2190    +--------------------+----------+-----------------+--------+  +---------------+----------+-------------+----------+--------------------------+  OUTFLOW VEIN PSV (cm/s)Diameter (cm)Depth (cm) Describe   +---------------+----------+-------------+----------+--------------------------+  Subclavian vein    occluded   +---------------+----------+-------------+----------+--------------------------+  Prox UA  1.1  1.10   aneurysmal   +---------------+----------+-------------+----------+--------------------------+  Mid UA  734  0.95   stenotic, aneurysmal and        Retained valve   +---------------+----------+-------------+----------+--------------------------+  Dist UA  426  0.51     +---------------+----------+-------------+----------+--------------------------+  AC Fossa  462  0.72     +---------------+----------+-------------+----------+--------------------------+  Prox Forearm     Occluded AVF    +---------------+----------+-------------+----------+--------------------------+  Summary:  Arteriovenous fistula-Velocities less than 100cm/s noted in the proximal upper  arm..  Arteriovenous fistula-Elevated velocities noted possibly retained valve in mid  upper arm.  Arteriovenous fistula-Thrombus noted in  subclavian vein, however, this was  difficult to visualize.  Arteriovenous fistula-Aneurysmal dilatation noted.  Arteriovenous fistula-Stenosis in addition to retained valve.  ASSESSMENT/PLAN: 68 y.o. male with ESRD here for evaluation of his left arm hemodialysis access  -pt had swelling in his distal arm after dialysis about 2 weeks ago. He does have 2+ pitting edema in his left hand. Dialysis duplex shows elevated velocities. The fistula does have a thrill. The duplex suggests there is an occlusion. Discussed with Dr. Trula Slade and he feels this since this is subacute, the area may be able to be crossed and intervention done. Will set up for fistulogram on a M/W/F.  -I did wrap his lower arm today to help with the swelling. Also instructed him to elevate his arm to help with swelling  Leontine Locket, PA-C  [facility]  [phone]  Clinic MD: Pt seen and examined with Dr. Laurie Panda, MD [facility] Office: [phone] Pager: [phone]  Marty Heck

## 2019-09-19 ENCOUNTER — Other Ambulatory Visit: Payer: Self-pay | Admitting: Surgery

## 2019-09-19 ENCOUNTER — Other Ambulatory Visit (HOSPITAL_COMMUNITY): Payer: Self-pay | Admitting: Surgery

## 2019-09-19 DIAGNOSIS — J90 Pleural effusion, not elsewhere classified: Secondary | ICD-10-CM

## 2019-09-19 DIAGNOSIS — R590 Localized enlarged lymph nodes: Secondary | ICD-10-CM

## 2019-10-03 ENCOUNTER — Encounter (HOSPITAL_COMMUNITY): Payer: Non-veteran care

## 2019-10-03 ENCOUNTER — Encounter (HOSPITAL_COMMUNITY): Payer: Self-pay

## 2019-10-17 ENCOUNTER — Ambulatory Visit (HOSPITAL_COMMUNITY)
Admission: RE | Admit: 2019-10-17 | Discharge: 2019-10-17 | Disposition: A | Discharge status: Home / Self Care | Payer: No Typology Code available for payment source | Source: Ambulatory Visit | Attending: Surgery | Admitting: Surgery

## 2019-10-17 ENCOUNTER — Other Ambulatory Visit: Payer: Self-pay

## 2019-10-17 DIAGNOSIS — R590 Localized enlarged lymph nodes: Secondary | ICD-10-CM | POA: Diagnosis present

## 2019-10-17 DIAGNOSIS — J90 Pleural effusion, not elsewhere classified: Secondary | ICD-10-CM | POA: Insufficient documentation

## 2019-10-17 LAB — GLUCOSE, CAPILLARY: Glucose-Capillary: 86 mg/dL (ref 70–99)

## 2019-10-17 MED ORDER — FLUDEOXYGLUCOSE F - 18 (FDG) INJECTION
9.2000 | Freq: Once | INTRAVENOUS | Status: AC | PRN
  Administered 2019-10-17: 9.2 via INTRAVENOUS

## 2020-08-24 ENCOUNTER — Encounter: Payer: Self-pay | Admitting: General Surgery

## 2020-08-24 ENCOUNTER — Ambulatory Visit (INDEPENDENT_AMBULATORY_CARE_PROVIDER_SITE_OTHER): Payer: Medicare PPO | Admitting: General Surgery

## 2020-08-24 ENCOUNTER — Other Ambulatory Visit: Payer: Self-pay

## 2020-08-24 VITALS — BP 158/87 | HR 76 | Temp 97.7°F | Resp 14 | Ht 72.0 in | Wt 178.0 lb

## 2020-08-24 DIAGNOSIS — K439 Ventral hernia without obstruction or gangrene: Secondary | ICD-10-CM

## 2020-08-24 NOTE — Patient Instructions (Signed)

## 2020-08-24 NOTE — Progress Notes (Signed)
Kevin Walker; [id number]; 08/17/1950   HPI Patient is a 70 year old black male who referred himself to my care for evaluation and treatment of a ventral hernia.  He states he has had a ventral hernia for some time, but seems to be increasing in size and does cause him discomfort when he is straining.  He denies any nausea or vomiting.  He has never had abdominal surgery in that area. He is currently on dialysis for end-stage renal disease.  He is on a transplant list, though he has been on that list for many years.  He was recently seen by thoracic surgery at [facility] and is in need of a VATS procedure for decortication due to persistent pleural effusion. Past Medical History:  Diagnosis Date  . Anemia    low iron  . CHF (congestive heart failure) ([place])   . Chronic kidney disease    Dialysis T/Th/Sa  . Dyspnea    on exertion  . GERD (gastroesophageal reflux disease)   . Heart murmur   . Hernia, abdominal   . History of kidney stones   . Hypertension   . PTSD (post-traumatic stress disorder)   . Sleep apnea    has a cpap - has not used it in past, but starting soon    Past Surgical History:  Procedure Laterality Date  . A/V FISTULAGRAM N/A 06/28/2018   Procedure: A/V FISTULAGRAM;  Surgeon: Elam Dutch, MD;  Location: [facility];  Service: Cardiovascular;  Laterality: N/A;  . A/V FISTULAGRAM Left 04/23/2019   Procedure: A/V FISTULAGRAM;  Surgeon: Marty Heck, MD;  Location: [facility];  Service: Cardiovascular;  Laterality: Left;  . AV FISTULA PLACEMENT Right   . AV FISTULA PLACEMENT Left    this one never used  . AV FISTULA PLACEMENT Left 08/28/2018   Procedure: Arteriovenous (Av) Fistula Creation;  Surgeon: Rosetta Posner, MD;  Location: [facility];  Service: Vascular;  Laterality: Left;  . BASCILIC VEIN TRANSPOSITION Left 11/11/2018   Procedure: SECOND STAGE BASILIC VEIN TRANSPOSITION LEFT ARM;  Surgeon: Rosetta Posner, MD;   Location: [facility];  Service: Vascular;  Laterality: Left;  . CARDIAC CATHETERIZATION     performed at the [facility] in [place]  . COLONOSCOPY    . FISTULOGRAM Right 06/11/2017   Procedure: FISTULOGRAM RIGHT ARTERIOVENOUS FISTULA WITH INTERVENTION;  Surgeon: Conrad [place], MD;  Location: [facility];  Service: Vascular;  Laterality: Right;  . INSERTION OF DIALYSIS CATHETER    . IR GENERIC HISTORICAL  12/02/2015   IR FLUORO GUIDE CV LINE LEFT 12/02/2015 Jacqulynn Cadet, MD [facility]  . IR GENERIC HISTORICAL  03/06/2016   IR REMOVAL TUN CV CATH W/O FL 03/06/2016 Markus Daft, MD [facility]  . PERIPHERAL VASCULAR BALLOON ANGIOPLASTY  04/23/2019   Procedure: PERIPHERAL VASCULAR BALLOON ANGIOPLASTY;  Surgeon: Marty Heck, MD;  Location: [facility];  Service: Cardiovascular;;  Left AVF  . TEMPORARY DIALYSIS CATHETER  06/28/2018   Procedure: TEMPORARY DIALYSIS CATHETER;  Surgeon: Elam Dutch, MD;  Location: [facility];  Service: Cardiovascular;;  Right Subclavian Dialysis catheter insertion    Family History  Problem Relation Age of Onset  . Diabetes Mother   . Heart disease Mother   . Heart disease Father     Current Outpatient Medications on File Prior to Visit  Medication Sig Dispense Refill  . amLODipine (NORVASC) 10 MG tablet Take 10 mg by mouth daily in the  afternoon.     . B Complex-C-Folic Acid (DIALYVITE PO) Take 1 tablet by mouth daily in the afternoon.     . bisacodyl (DULCOLAX) 5 MG EC tablet Take 5 mg by mouth daily in the afternoon.     . bismuth subsalicylate (PEPTO BISMOL) 262 MG/15ML suspension Take 30 mLs by mouth every 6 (six) hours as needed for indigestion.    . furosemide (LASIX) 80 MG tablet Take 80 mg by mouth daily in the afternoon.     [name] HYDROcodone-acetaminophen (NORCO) 7.5-325 MG tablet Take 1 tablet by mouth 2 (two) times daily as needed for moderate pain.    [name] latanoprost (XALATAN) 0.005 % ophthalmic solution Place 1 drop into both eyes at  bedtime.    . lidocaine-prilocaine (EMLA) cream Apply 1 application topically as needed (for access).    . naloxone (NARCAN) nasal spray 4 mg/0.1 mL Place 1 spray into the nose as needed (opioid overdose).    [name] omeprazole (PRILOSEC) 20 MG capsule Take 20 mg by mouth 2 (two) times daily with a meal.    . Polyvinyl Alcohol-Povidone (REFRESH OP) Place 1 drop into both eyes 2 (two) times a day.     . traZODone (DESYREL) 100 MG tablet Take 100 mg by mouth at bedtime.    [name] atenolol (TENORMIN) 50 MG tablet Take 50 mg by mouth daily in the afternoon.  (Patient not taking: Reported on 08/24/2020)    . calcitRIOL (ROCALTROL) 0.25 MCG capsule Take 0.25 mcg by mouth once a week.  (Patient not taking: Reported on 08/24/2020)    . febuxostat (ULORIC) 40 MG tablet Take 40 mg by mouth daily as needed (gout flare). (Patient not taking: Reported on 08/24/2020)    . ferrous sulfate 325 (65 FE) MG tablet Take 325 mg by mouth daily in the afternoon.  (Patient not taking: Reported on 08/24/2020)    . sevelamer carbonate (RENVELA) 800 MG tablet Take 800 mg by mouth 3 (three) times daily with meals.  (Patient not taking: Reported on 08/24/2020)     No current facility-administered medications on file prior to visit.    No Known Allergies  Social History   Substance and Sexual Activity  Alcohol Use No   Comment: years ago, none now    Social History   Tobacco Use  Smoking Status Never Smoker  Smokeless Tobacco Never Used    Review of Systems  Constitutional: Negative.   HENT: Negative.   Eyes: Negative.   Respiratory: Positive for shortness of breath.   Cardiovascular: Negative.   Gastrointestinal: Negative.   Genitourinary: Positive for urgency.  Musculoskeletal: Negative.   Skin: Negative.   Neurological: Negative.   Endo/Heme/Allergies: Negative.   Psychiatric/Behavioral: Negative.     Objective   Vitals:   08/24/20 1405  BP: (!) 158/87  Pulse: 76  Resp: 14  Temp: 97.7 F (36.5 C)  SpO2:  93%    Physical Exam Vitals reviewed.  Constitutional:      Appearance: Normal appearance. He is not ill-appearing.  HENT:     Head: Normocephalic and atraumatic.  Cardiovascular:     Rate and Rhythm: Normal rate and regular rhythm.     Heart sounds: Normal heart sounds. No murmur heard. No friction rub. No gallop.   Pulmonary:     Effort: Pulmonary effort is normal. No respiratory distress.     Breath sounds: Normal breath sounds. No stridor. No wheezing, rhonchi or rales.  Abdominal:     General: Bowel sounds are normal.  There is no distension.     Palpations: Abdomen is soft. There is no mass.     Tenderness: There is no abdominal tenderness. There is no guarding or rebound.     Hernia: A hernia is present.     Comments: A reducible subcentimeter supraumbilical midline hernia is present.  No surgical scars are present.  Skin:    General: Skin is warm and dry.  Neurological:     Mental Status: He is alert and oriented to person, place, and time.   Epic records reviewed  Assessment  Ventral hernia End-stage renal disease, on transplant list, need for decortication by thoracic surgery for persistent pleural effusion Plan   I told the patient that he would need the VATS procedure before consideration of any elective ventral hernia repair.  He understands this.  His risk of incarceration is low.  Literature was given concerning ventral hernias.  He will call me if it becomes more symptomatic and has undergone the thoracic surgery.  Follow-up as needed.

## 2021-09-15 NOTE — Progress Notes (Signed)
? ? ? ? ?Office Note  ? ? ? ?CC:  Left arm swelling ?Requesting Provider:  [facility] ? ?HPI: Kevin Walker is a 71 y.o. ([birth year]) male presenting at the request of .[facility], [facility] for left arm swelling with known left-sided HD access. Pt was last seen in December of 2020 and underwent left brachiobasilic fistulagram with venoplasty of innominate vein (12 mm x 40 mm drug coated Lutonix). ? ?On exam today, [name] was doing well.  He has had 10 days of severe left upper extremity swelling.  After recent dialysis session, severe swelling was appreciated in the left upper extremity.  Involving the entirety of the arm including the hands and fingers.  This has slowly been resolving, however there continues to be significant asymmetry.  No signs or symptoms of steal syndrome. ? ? ?Past Medical History:  ?Diagnosis Date  ? Anemia   ? low iron  ? CHF (congestive heart failure) ([place])   ? Chronic kidney disease   ? Dialysis T/Th/Sa  ? Dyspnea   ? on exertion  ? GERD (gastroesophageal reflux disease)   ? Heart murmur   ? Hernia, abdominal   ? History of kidney stones   ? Hypertension   ? PTSD (post-traumatic stress disorder)   ? Sleep apnea   ? has a cpap - has not used it in past, but starting soon  ? ? ?Past Surgical History:  ?Procedure Laterality Date  ? A/V FISTULAGRAM N/A 06/28/2018  ? Procedure: A/V FISTULAGRAM;  Surgeon: Elam Dutch, MD;  Location: [facility];  Service: Cardiovascular;  Laterality: N/A;  ? A/V FISTULAGRAM Left 04/23/2019  ? Procedure: A/V FISTULAGRAM;  Surgeon: Marty Heck, MD;  Location: [facility];  Service: Cardiovascular;  Laterality: Left;  ? AV FISTULA PLACEMENT Right   ? AV FISTULA PLACEMENT Left   ? this one never used  ? AV FISTULA PLACEMENT Left 08/28/2018  ? Procedure: Arteriovenous (Av) Fistula Creation;  Surgeon: Rosetta Posner, MD;  Location: [facility];  Service: Vascular;  Laterality: Left;  ? BASCILIC VEIN TRANSPOSITION Left 11/11/2018  ?  Procedure: SECOND STAGE BASILIC VEIN TRANSPOSITION LEFT ARM;  Surgeon: Rosetta Posner, MD;  Location: [facility];  Service: Vascular;  Laterality: Left;  ? CARDIAC CATHETERIZATION    ? performed at the [facility] in [place]  ? COLONOSCOPY    ? FISTULOGRAM Right 06/11/2017  ? Procedure: FISTULOGRAM RIGHT ARTERIOVENOUS FISTULA WITH INTERVENTION;  Surgeon: Conrad Anson, MD;  Location: [facility];  Service: Vascular;  Laterality: Right;  ? INSERTION OF DIALYSIS CATHETER    ? IR GENERIC HISTORICAL  12/02/2015  ? IR FLUORO GUIDE CV LINE LEFT 12/02/2015 Jacqulynn Cadet, MD [facility]  ? IR GENERIC HISTORICAL  03/06/2016  ? IR REMOVAL TUN CV CATH W/O FL 03/06/2016 Markus Daft, MD [facility]  ? PERIPHERAL VASCULAR BALLOON ANGIOPLASTY  04/23/2019  ? Procedure: PERIPHERAL VASCULAR BALLOON ANGIOPLASTY;  Surgeon: Marty Heck, MD;  Location: [facility];  Service: Cardiovascular;;  Left AVF  ? TEMPORARY DIALYSIS CATHETER  06/28/2018  ? Procedure: TEMPORARY DIALYSIS CATHETER;  Surgeon: Elam Dutch, MD;  Location: [facility];  Service: Cardiovascular;;  Right Subclavian Dialysis catheter insertion  ? ? ?Social History  ? ?Socioeconomic History  ? Marital status: Married  ?  Spouse name: Not on file  ? Number of children: Not on file  ? Years of education: Not on file  ? Highest education level:  Not on file  ?Occupational History  ? Not on file  ?Tobacco Use  ? Smoking status: Never  ? Smokeless tobacco: Never  ?Vaping Use  ? Vaping Use: Never used  ?Substance and Sexual Activity  ? Alcohol use: No  ?  Comment: years ago, none now  ? Drug use: No  ?  Comment: years ago  ? Sexual activity: Not on file  ?Other Topics Concern  ? Not on file  ?Social History Narrative  ? Not on file  ? ?Social Determinants of Health  ? ?Financial Resource Strain: Not on file  ?Food Insecurity: Not on file  ?Transportation Needs: Not on file  ?Physical Activity: Not on file  ?Stress: Not on file  ?Social Connections: Not on file   ?Intimate Partner Violence: Not on file  ? ?Family History  ?Problem Relation Age of Onset  ? Diabetes Mother   ? Heart disease Mother   ? Heart disease Father   ? ? ?Current Outpatient Medications  ?Medication Sig Dispense Refill  ? amLODipine (NORVASC) 10 MG tablet Take 10 mg by mouth daily in the afternoon.     ? atenolol (TENORMIN) 50 MG tablet Take 50 mg by mouth daily in the afternoon.  (Patient not taking: Reported on 08/24/2020)    ? B Complex-C-Folic Acid (DIALYVITE PO) Take 1 tablet by mouth daily in the afternoon.     ? bisacodyl (DULCOLAX) 5 MG EC tablet Take 5 mg by mouth daily in the afternoon.     ? bismuth subsalicylate (PEPTO BISMOL) 262 MG/15ML suspension Take 30 mLs by mouth every 6 (six) hours as needed for indigestion.    ? calcitRIOL (ROCALTROL) 0.25 MCG capsule Take 0.25 mcg by mouth once a week.  (Patient not taking: Reported on 08/24/2020)    ? febuxostat (ULORIC) 40 MG tablet Take 40 mg by mouth daily as needed (gout flare). (Patient not taking: Reported on 08/24/2020)    ? ferrous sulfate 325 (65 FE) MG tablet Take 325 mg by mouth daily in the afternoon.  (Patient not taking: Reported on 08/24/2020)    ? furosemide (LASIX) 80 MG tablet Take 80 mg by mouth daily in the afternoon.     ? HYDROcodone-acetaminophen (NORCO) 7.5-325 MG tablet Take 1 tablet by mouth 2 (two) times daily as needed for moderate pain.    ? latanoprost (XALATAN) 0.005 % ophthalmic solution Place 1 drop into both eyes at bedtime.    ? lidocaine-prilocaine (EMLA) cream Apply 1 application topically as needed (for access).    ? naloxone (NARCAN) nasal spray 4 mg/0.1 mL Place 1 spray into the nose as needed (opioid overdose).    ? omeprazole (PRILOSEC) 20 MG capsule Take 20 mg by mouth 2 (two) times daily with a meal.    ? Polyvinyl Alcohol-Povidone (REFRESH OP) Place 1 drop into both eyes 2 (two) times a day.     ? sevelamer carbonate (RENVELA) 800 MG tablet Take 800 mg by mouth 3 (three) times daily with meals.  (Patient  not taking: Reported on 08/24/2020)    ? traZODone (DESYREL) 100 MG tablet Take 100 mg by mouth at bedtime.    ? ?No current facility-administered medications for this visit.  ? ? ?No Known Allergies ? ? ?REVIEW OF SYSTEMS:  ?[X]  denotes positive finding, [ ]  denotes negative finding ?Cardiac  Comments:  ?Chest pain or chest pressure:    ?Shortness of breath upon exertion:    ?Short of breath when lying flat:    ?  Irregular heart rhythm:    ?    ?Vascular    ?Pain in calf, thigh, or hip brought on by ambulation:    ?Pain in feet at night that wakes you up from your sleep:     ?Blood clot in your veins:    ?Leg swelling:     ?    ?Pulmonary    ?Oxygen at home:    ?Productive cough:     ?Wheezing:     ?    ?Neurologic    ?Sudden weakness in arms or legs:     ?Sudden numbness in arms or legs:     ?Sudden onset of difficulty speaking or slurred speech:    ?Temporary loss of vision in one eye:     ?Problems with dizziness:     ?    ?Gastrointestinal    ?Blood in stool:     ?Vomited blood:     ?    ?Genitourinary    ?Burning when urinating:     ?Blood in urine:    ?    ?Psychiatric    ?Major depression:     ?    ?Hematologic    ?Bleeding problems:    ?Problems with blood clotting too easily:    ?    ?Skin    ?Rashes or ulcers:    ?    ?Constitutional    ?Fever or chills:    ? ? ?PHYSICAL EXAMINATION: ? ?There were no vitals filed for this visit. ? ?General:  WDWN in NAD; vital signs documented above ?Gait: Not observed ?HENT: WNL, normocephalic ?Pulmonary: normal non-labored breathing , without wheezing ?Cardiac: regular HR, ?Abdomen: soft, NT, no masses ?Skin: without rashes ?Vascular Exam/Pulses: ? Right Left  ?Radial 2+ (normal) 2+ (normal)  ?Ulnar 2+ (normal) 2+ (normal)  ?    ?    ?    ?    ? ?Extremities: Fistula with excellent thrill ? ? ?Musculoskeletal: no muscle wasting or atrophy  ?Neurologic: A&O X 3;  No focal weakness or paresthesias are detected ?Psychiatric:  The pt has Normal affect. ? ? ?Non-Invasive  Vascular Imaging:   ?- ? ? ? ?ASSESSMENT/PLAN: Jonathon Tan is a 71 y.o. male presenting with left upper extremity swelling with known left-sided AV fistula.  I am concerned there could be an outflow stenosis whic

## 2021-09-16 ENCOUNTER — Ambulatory Visit (INDEPENDENT_AMBULATORY_CARE_PROVIDER_SITE_OTHER): Payer: No Typology Code available for payment source | Admitting: Vascular Surgery

## 2021-09-16 ENCOUNTER — Other Ambulatory Visit: Payer: Self-pay

## 2021-09-16 VITALS — BP 143/70 | HR 75 | Temp 97.9°F | Resp 20 | Ht 72.0 in

## 2021-09-16 DIAGNOSIS — N186 End stage renal disease: Secondary | ICD-10-CM

## 2021-09-16 DIAGNOSIS — Z992 Dependence on renal dialysis: Secondary | ICD-10-CM

## 2021-09-16 MED ORDER — J-HOOK ARMSLEEVE MISC: 2.0000 | Freq: Every day | 0 refills | Status: AC

## 2021-09-16 MED ORDER — SODIUM CHLORIDE 0.9 % IV SOLN: 250.0000 mL | INTRAVENOUS | Status: AC | PRN

## 2021-09-16 MED ORDER — COMPRESSION & SUPPORT GLOVES L MISC: 2.0000 | Freq: Every day | 0 refills | Status: AC

## 2021-09-16 MED ORDER — SODIUM CHLORIDE 0.9% FLUSH: 3.0000 mL | INTRAVENOUS | Status: AC | PRN

## 2021-09-16 MED ORDER — SODIUM CHLORIDE 0.9% FLUSH: 3.0000 mL | Freq: Two times a day (BID) | INTRAVENOUS | Status: AC

## 2021-09-21 ENCOUNTER — Ambulatory Visit: Payer: Non-veteran care | Admitting: Vascular Surgery

## 2021-09-28 ENCOUNTER — Other Ambulatory Visit: Payer: Self-pay

## 2021-09-28 ENCOUNTER — Encounter (HOSPITAL_COMMUNITY): Payer: Self-pay | Admitting: Vascular Surgery

## 2021-09-28 ENCOUNTER — Ambulatory Visit (HOSPITAL_COMMUNITY)
Admission: RE | Admit: 2021-09-28 | Discharge: 2021-09-28 | Disposition: A | Discharge status: Home / Self Care | Payer: No Typology Code available for payment source | Attending: Vascular Surgery | Admitting: Vascular Surgery

## 2021-09-28 ENCOUNTER — Encounter (HOSPITAL_COMMUNITY)
Admission: RE | Disposition: A | Discharge status: Home / Self Care | Payer: Self-pay | Source: Home / Self Care | Attending: Vascular Surgery

## 2021-09-28 DIAGNOSIS — Y832 Surgical operation with anastomosis, bypass or graft as the cause of abnormal reaction of the patient, or of later complication, without mention of misadventure at the time of the procedure: Secondary | ICD-10-CM | POA: Diagnosis not present

## 2021-09-28 DIAGNOSIS — N185 Chronic kidney disease, stage 5: Secondary | ICD-10-CM | POA: Diagnosis not present

## 2021-09-28 DIAGNOSIS — N186 End stage renal disease: Secondary | ICD-10-CM

## 2021-09-28 DIAGNOSIS — T82858A Stenosis of vascular prosthetic devices, implants and grafts, initial encounter: Secondary | ICD-10-CM | POA: Insufficient documentation

## 2021-09-28 DIAGNOSIS — T82868A Thrombosis of vascular prosthetic devices, implants and grafts, initial encounter: Secondary | ICD-10-CM | POA: Diagnosis not present

## 2021-09-28 DIAGNOSIS — M7989 Other specified soft tissue disorders: Secondary | ICD-10-CM | POA: Diagnosis present

## 2021-09-28 HISTORY — PX: PERIPHERAL VASCULAR BALLOON ANGIOPLASTY: CATH118281

## 2021-09-28 HISTORY — PX: PERIPHERAL VASCULAR THROMBECTOMY: CATH118306

## 2021-09-28 LAB — POCT I-STAT, CHEM 8
BUN: 37 mg/dL — ABNORMAL HIGH (ref 8–23)
Calcium, Ion: 1.05 mmol/L — ABNORMAL LOW (ref 1.15–1.40)
Chloride: 98 mmol/L (ref 98–111)
Creatinine, Ser: 5.5 mg/dL — ABNORMAL HIGH (ref 0.61–1.24)
Glucose, Bld: 90 mg/dL (ref 70–99)
HCT: 35 % — ABNORMAL LOW (ref 39.0–52.0)
Hemoglobin: 11.9 g/dL — ABNORMAL LOW (ref 13.0–17.0)
Potassium: 3.8 mmol/L (ref 3.5–5.1)
Sodium: 138 mmol/L (ref 135–145)
TCO2: 30 mmol/L (ref 22–32)

## 2021-09-28 SURGERY — PERIPHERAL VASCULAR THROMBECTOMY
Anesthesia: LOCAL

## 2021-09-28 MED ORDER — HEPARIN (PORCINE) IN NACL 1000-0.9 UT/500ML-% IV SOLN
INTRAVENOUS | Status: DC | PRN
  Administered 2021-09-28: 500 mL

## 2021-09-28 MED ORDER — IODIXANOL 320 MG/ML IV SOLN
INTRAVENOUS | Status: DC | PRN
  Administered 2021-09-28: 80 mL

## 2021-09-28 MED ORDER — LIDOCAINE HCL (PF) 1 % IJ SOLN
INTRAMUSCULAR | Status: DC | PRN
  Administered 2021-09-28: 2 mL

## 2021-09-28 MED ORDER — FENTANYL CITRATE (PF) 100 MCG/2ML IJ SOLN
INTRAMUSCULAR | Status: DC | PRN
Start: 2021-09-28 — End: 2021-09-28
  Administered 2021-09-28 (×2): 50 ug via INTRAVENOUS

## 2021-09-28 MED ORDER — MIDAZOLAM HCL 2 MG/2ML IJ SOLN
INTRAMUSCULAR | Status: DC | PRN
  Administered 2021-09-28 (×2): 1 mg via INTRAVENOUS

## 2021-09-28 MED ORDER — HEPARIN SODIUM (PORCINE) 1000 UNIT/ML IJ SOLN
INTRAMUSCULAR | Status: AC
  Filled 2021-09-28: qty 10

## 2021-09-28 MED ORDER — MIDAZOLAM HCL 2 MG/2ML IJ SOLN
INTRAMUSCULAR | Status: AC
Start: 2021-09-28 — End: ?
  Filled 2021-09-28: qty 2

## 2021-09-28 MED ORDER — FENTANYL CITRATE (PF) 100 MCG/2ML IJ SOLN
INTRAMUSCULAR | Status: AC
  Filled 2021-09-28: qty 2

## 2021-09-28 MED ORDER — LIDOCAINE HCL (PF) 1 % IJ SOLN
INTRAMUSCULAR | Status: AC
  Filled 2021-09-28: qty 30

## 2021-09-28 MED ORDER — HEPARIN (PORCINE) IN NACL 1000-0.9 UT/500ML-% IV SOLN
INTRAVENOUS | Status: AC
  Filled 2021-09-28: qty 1000

## 2021-09-28 MED ORDER — HEPARIN SODIUM (PORCINE) 1000 UNIT/ML IJ SOLN
INTRAMUSCULAR | Status: DC | PRN
Start: 2021-09-28 — End: 2021-09-28
  Administered 2021-09-28: 5000 [IU] via INTRAVENOUS

## 2021-09-28 SURGICAL SUPPLY — 18 items
BAG SNAP BAND KOVER 36X36 (MISCELLANEOUS) ×3 IMPLANT
BALLN MUSTANG 12X80X75 (BALLOONS) ×3
BALLOON MUSTANG 12X80X75 (BALLOONS) ×1 IMPLANT
CANISTER PENUMBRA ENGINE (MISCELLANEOUS) ×2 IMPLANT
CATH ANGIO 5F BER2 65CM (CATHETERS) ×2 IMPLANT
CATH INDIGO D 50CM (CATHETERS) ×2 IMPLANT
COVER DOME SNAP 22 D (MISCELLANEOUS) ×3 IMPLANT
KIT ENCORE 26 ADVANTAGE (KITS) ×2 IMPLANT
KIT MICROPUNCTURE NIT STIFF (SHEATH) ×2 IMPLANT
PROTECTION STATION PRESSURIZED (MISCELLANEOUS) ×3
SHEATH PINNACLE 8F 10CM (SHEATH) ×2 IMPLANT
SHEATH PROBE COVER 6X72 (BAG) ×3 IMPLANT
STATION PROTECTION PRESSURIZED (MISCELLANEOUS) ×2 IMPLANT
STOPCOCK MORSE 400PSI 3WAY (MISCELLANEOUS) ×3 IMPLANT
TRAY PV CATH (CUSTOM PROCEDURE TRAY) ×3 IMPLANT
TUBING CIL FLEX 10 FLL-RA (TUBING) ×5 IMPLANT
WIRE BENTSON .035X145CM (WIRE) ×2 IMPLANT
WIRE SHEPHERD 6G .018 (WIRE) ×2 IMPLANT

## 2021-09-28 NOTE — Op Note (Signed)
    Patient name: Kevin Walker MRN: [id number] DOB: November 29, 1950 Sex: male  09/28/2021 Pre-operative Diagnosis: Left upper extremity swelling, with concern for central stenosis Post-operative diagnosis:  Same Surgeon:  Broadus John, MD Procedure Performed: 1.  Ultrasound-guided micropuncture access of the left arm fistula 2.  Fistulogram 3.  Percutaneous mechanical thrombectomy using 8 French penumbra CAT D system 4.  Brachiocephalic vein venoplasty 12 mm x 60 mm 5.  Access managed using Monocryl suture 6.  30 minutes moderate sedation   Indications:  [name] is a 71 y.o. male presenting with left upper extremity swelling with known left-sided AV fistula.  I am concerned there could be an outflow stenosis which is hindering venous return and is leading to arm swelling.  Interestingly, there is severe, 3+ pitting edema in the arm, hand, fingers which is more consistent with lymphedema.  I had a long conversation with [name] regarding the above.  I think he would benefit from left upper extremity fistulogram to ensure there is no central stenosis leading to left upper extremity swelling.  After discussing the risk and benefits, [name] elected to proceed.  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  Moderate anesthesia was induced and the patient was prepped and draped in standard fashion.  A timeout was performed. The case began with ultrasound-guided micropuncture access of the left brachial basilic fistula and antegrade fashion.  A wire was advanced into the inferior vena cava.  Fistulogram followed demonstrating central stenosis of the brachiocephalic vein as well as focal, mobile thrombus in the brachiobasilic fistula.  An 8 French CAT D penumbra system was brought onto the field  and the thrombus was evacuated.  Follow-up angiogram demonstrated excellent result with resolution of the thrombotic burden.  Next, a 12 x 60 mm angioplasty balloon was brought onto the field and expanded along the entirety of the brachiocephalic vein into the subclavian vein. Follow-up angiography demonstrated excellent result with resolution of the stenotic lesion.   Impression: Successful percutaneous mechanical thrombectomy of flow-limiting thrombus within the brachiobasilic fistula, successful balloon venoplasty of the brachiocephalic vein with resolution of flow-limiting stenosis.   Cassandria Santee, MD [facility] Office: [phone]

## 2021-09-28 NOTE — H&P (Signed)
Office Note   Patient seen and examined in preop holding.  No complaints. No changes to medication history or physical exam since last seen in clinic. After discussing the risks and benefits of left arm fistulagram, Kevin Walker elected to proceed.   Kevin John MD   CC:  Left arm swelling Requesting Provider:  No ref. provider found  HPI: Kevin Walker is a 71 y.o. (1950-06-07) male presenting at the request of .[facility] for left arm swelling with known left-sided HD access. Pt was last seen in December of 2020 and underwent left brachiobasilic fistulagram with venoplasty of innominate vein (12 mm x 40 mm drug coated Lutonix).  On exam today, Kevin Walker was doing well.  He has had 10 days of severe left upper extremity swelling.  After recent dialysis session, severe swelling was appreciated in the left upper extremity.  Involving the entirety of the arm including the hands and fingers.  This has slowly been resolving, however there continues to be significant asymmetry.  No signs or symptoms of steal syndrome.   Past Medical History:  Diagnosis Date   Anemia    low iron   CHF (congestive heart failure) ([facility])    Chronic kidney disease    Dialysis T/Th/Sa   Dyspnea    on exertion   GERD (gastroesophageal reflux disease)    Heart murmur    Hernia, abdominal    History of kidney stones    Hypertension    PTSD (post-traumatic stress disorder)    Sleep apnea    has a cpap - has not used it in past, but starting soon    Past Surgical History:  Procedure Laterality Date   A/V FISTULAGRAM N/A 06/28/2018   Procedure: A/V FISTULAGRAM;  Surgeon: Elam Dutch, MD;  Location: [facility];  Service: Cardiovascular;  Laterality: N/A;   A/V FISTULAGRAM Left 04/23/2019   Procedure: A/V FISTULAGRAM;  Surgeon: Marty Heck, MD;  Location: [facility];  Service: Cardiovascular;  Laterality: Left;   AV FISTULA PLACEMENT Right    AV FISTULA  PLACEMENT Left    this one never used   AV FISTULA PLACEMENT Left 08/28/2018   Procedure: Arteriovenous (Av) Fistula Creation;  Surgeon: Rosetta Posner, MD;  Location: [facility];  Service: Vascular;  Laterality: Left;   [place] Left 11/11/2018   Procedure: SECOND STAGE BASILIC VEIN TRANSPOSITION LEFT ARM;  Surgeon: Rosetta Posner, MD;  Location: [facility];  Service: Vascular;  Laterality: Left;   CARDIAC CATHETERIZATION     performed at the [facility] in [place]   COLONOSCOPY     FISTULOGRAM Right 06/11/2017   Procedure: FISTULOGRAM RIGHT ARTERIOVENOUS FISTULA WITH INTERVENTION;  Surgeon: Conrad Dodge, MD;  Location: [place] [facility];  Service: Vascular;  Laterality: Right;   INSERTION OF DIALYSIS CATHETER     IR GENERIC HISTORICAL  12/02/2015   IR FLUORO GUIDE CV LINE LEFT 12/02/2015 Jacqulynn Cadet, MD [facility]   IR GENERIC HISTORICAL  03/06/2016   IR REMOVAL TUN CV CATH W/O FL 03/06/2016 Markus Daft, MD [facility]   PERIPHERAL VASCULAR BALLOON ANGIOPLASTY  04/23/2019   Procedure: PERIPHERAL VASCULAR BALLOON ANGIOPLASTY;  Surgeon: Marty Heck, MD;  Location: [facility];  Service: Cardiovascular;;  Left AVF   PERIPHERAL VASCULAR BALLOON ANGIOPLASTY  09/28/2021   Procedure: PERIPHERAL VASCULAR BALLOON ANGIOPLASTY;  Surgeon: Kevin John, MD;  Location: [facility];  Service: Cardiovascular;;   PERIPHERAL VASCULAR  THROMBECTOMY  09/28/2021   Procedure: PERIPHERAL VASCULAR THROMBECTOMY;  Surgeon: Kevin John, MD;  Location: [facility];  Service: Cardiovascular;;  Left AVF   TEMPORARY DIALYSIS CATHETER  06/28/2018   Procedure: TEMPORARY DIALYSIS CATHETER;  Surgeon: Elam Dutch, MD;  Location: [facility];  Service: Cardiovascular;;  Right Subclavian Dialysis catheter insertion    Social History   Socioeconomic History   Marital status: Married    Spouse name: Not on file   Number of children: Not on file   Years of education: Not on file    Highest education level: Not on file  Occupational History   Not on file  Tobacco Use   Smoking status: Never   Smokeless tobacco: Never  Vaping Use   Vaping Use: Never used  Substance and Sexual Activity   Alcohol use: No    Comment: years ago, none now   Drug use: No    Comment: years ago   Sexual activity: Not on file  Other Topics Concern   Not on file  Social History Narrative   Not on file   Social Determinants of Health   Financial Resource Strain: Not on file  Food Insecurity: Not on file  Transportation Needs: Not on file  Physical Activity: Not on file  Stress: Not on file  Social Connections: Not on file  Intimate Partner Violence: Not on file   Family History  Problem Relation Age of Onset   Diabetes Mother    Heart disease Mother    Heart disease Father     Current Facility-Administered Medications  Medication Dose Route Frequency Provider Last Rate Last Admin   0.9 %  sodium chloride infusion  250 mL Intravenous PRN Kevin John, MD       fentaNYL (SUBLIMAZE) injection    PRN Kevin John, MD   50 mcg at 09/28/21 1111   Heparin (Porcine) in NaCl 1000-0.9 UT/500ML-% SOLN    PRN Kevin John, MD   500 mL at 09/28/21 1028   heparin sodium (porcine) injection    PRN Kevin John, MD   5,000 Units at 09/28/21 1100   iodixanol (VISIPAQUE) 320 MG/ML injection    PRN Kevin John, MD   80 mL at 09/28/21 1130   lidocaine (PF) (XYLOCAINE) 1 % injection    PRN Kevin John, MD   2 mL at 09/28/21 1038   midazolam (VERSED) injection    PRN Kevin John, MD   1 mg at 09/28/21 1111   sodium chloride flush (NS) 0.9 % injection 3 mL  3 mL Intravenous Q12H Kevin John, MD       sodium chloride flush (NS) 0.9 % injection 3 mL  3 mL Intravenous PRN Kevin John, MD       Current Outpatient Medications  Medication Sig Dispense Refill   amLODipine (NORVASC) 10 MG tablet Take 10 mg by mouth daily in the afternoon.      B Complex-C-Folic  Acid (DIALYVITE PO) Take 1 tablet by mouth daily in the afternoon.      HYDROcodone-acetaminophen (NORCO) 7.5-325 MG tablet Take 1 tablet by mouth 2 (two) times daily as needed for moderate pain.     latanoprost (XALATAN) 0.005 % ophthalmic solution Place 1 drop into both eyes at bedtime.     omeprazole (PRILOSEC) 20 MG capsule Take 20 mg by mouth 2 (two) times daily with a meal.     Polyvinyl Alcohol-Povidone (REFRESH  OP) Place 1 drop into both eyes 2 (two) times a day.      traZODone (DESYREL) 100 MG tablet Take 100 mg by mouth at bedtime.     atenolol (TENORMIN) 50 MG tablet Take 50 mg by mouth daily in the afternoon.     bisacodyl (DULCOLAX) 5 MG EC tablet Take 5 mg by mouth daily in the afternoon.      bismuth subsalicylate (PEPTO BISMOL) 262 MG/15ML suspension Take 30 mLs by mouth every 6 (six) hours as needed for indigestion.     calcitRIOL (ROCALTROL) 0.25 MCG capsule Take 0.25 mcg by mouth once a week.     Elastic Bandages & Supports (COMPRESSION & SUPPORT GLOVES L) MISC 2 Devices by Does not apply route daily. 2 each 0   Elastic Bandages & Supports (J-HOOK ARMSLEEVE) MISC 2 Devices by Does not apply route daily. 2 each 0   febuxostat (ULORIC) 40 MG tablet Take 40 mg by mouth daily as needed (gout flare).     ferrous sulfate 325 (65 FE) MG tablet Take 325 mg by mouth daily in the afternoon.     furosemide (LASIX) 80 MG tablet Take 80 mg by mouth daily in the afternoon.      lidocaine-prilocaine (EMLA) cream Apply 1 application topically as needed (for access).     naloxone (NARCAN) nasal spray 4 mg/0.1 mL Place 1 spray into the nose as needed (opioid overdose).     sevelamer carbonate (RENVELA) 800 MG tablet Take 800 mg by mouth 3 (three) times daily with meals.      No Known Allergies   REVIEW OF SYSTEMS:  [X]  denotes positive finding, [ ]  denotes negative finding Cardiac  Comments:  Chest pain or chest pressure:    Shortness of breath upon exertion:    Short of breath when  lying flat:    Irregular heart rhythm:        Vascular    Pain in calf, thigh, or hip brought on by ambulation:    Pain in feet at night that wakes you up from your sleep:     Blood clot in your veins:    Leg swelling:         Pulmonary    Oxygen at home:    Productive cough:     Wheezing:         Neurologic    Sudden weakness in arms or legs:     Sudden numbness in arms or legs:     Sudden onset of difficulty speaking or slurred speech:    Temporary loss of vision in one eye:     Problems with dizziness:         Gastrointestinal    Blood in stool:     Vomited blood:         Genitourinary    Burning when urinating:     Blood in urine:        Psychiatric    Major depression:         Hematologic    Bleeding problems:    Problems with blood clotting too easily:        Skin    Rashes or ulcers:        Constitutional    Fever or chills:      PHYSICAL EXAMINATION:  Vitals:   09/28/21 1124 09/28/21 1140 09/28/21 1155 09/28/21 1210  BP: (!) 141/79 (!) 148/83 134/77 133/82  Pulse: 78 72 73 70  Resp: 20  Temp:      TempSrc:      SpO2: 100% 92% 92% (!) 89%  Weight:      Height:        General:  WDWN in NAD; vital signs documented above Gait: Not observed HENT: WNL, normocephalic Pulmonary: normal non-labored breathing , without wheezing Cardiac: regular HR, Abdomen: soft, NT, no masses Skin: without rashes Vascular Exam/Pulses:  Right Left  Radial 2+ (normal) 2+ (normal)  Ulnar 2+ (normal) 2+ (normal)                   Extremities: Fistula with excellent thrill   Musculoskeletal: no muscle wasting or atrophy  Neurologic: A&O X 3;  No focal weakness or paresthesias are detected Psychiatric:  The pt has Normal affect.   Non-Invasive Vascular Imaging:   -    ASSESSMENT/PLAN: Kevin Walker is a 71 y.o. male presenting with left upper extremity swelling with known left-sided AV fistula.  I am concerned there could be an outflow stenosis which  is hindering venous return and is leading to arm swelling.  Interestingly, there is severe, 3+ pitting edema in the arm, hand, fingers which is more consistent with lymphedema.  I had a long conversation with Kevin Walker regarding the above.  I think he would benefit from left upper extremity fistulogram to ensure there is no central stenosis leading to left upper extremity swelling.  I have also ordered him a compression sleeve and compression glove, and asked him to elevate his arm when possible to decrease the swelling in the left arm.  After discussing the risk and benefits of left arm fistulogram, Kevin Walker elected to proceed.   Kevin John, MD [facility] [phone]

## 2021-09-28 NOTE — Progress Notes (Signed)
Office Note   Patient seen and examined in preop holding.  No complaints. No changes to medication history or physical exam since last seen in clinic. After discussing the risks and benefits of left arm fistulagram, Kevin Walker elected to proceed.   Broadus John MD   CC:  Left arm swelling Requesting Provider:  No ref. provider found  HPI: Kevin Walker is a 71 y.o. ([birth year]) male presenting at the request of .[facility]inic, [facility] for left arm swelling with known left-sided HD access. Pt was last seen in December of 2020 and underwent left brachiobasilic fistulagram with venoplasty of innominate vein (12 mm x 40 mm drug coated Lutonix).  On exam today, Kevin Walker was doing well.  He has had 10 days of severe left upper extremity swelling.  After recent dialysis session, severe swelling was appreciated in the left upper extremity.  Involving the entirety of the arm including the hands and fingers.  This has slowly been resolving, however there continues to be significant asymmetry.  No signs or symptoms of steal syndrome.   Past Medical History:  Diagnosis Date   Anemia    low iron   CHF (congestive heart failure) (HCC)    Chronic kidney disease    Dialysis T/Th/Sa   Dyspnea    on exertion   GERD (gastroesophageal reflux disease)    Heart murmur    Hernia, abdominal    History of kidney stones    Hypertension    PTSD (post-traumatic stress disorder)    Sleep apnea    has a cpap - has not used it in past, but starting soon    Past Surgical History:  Procedure Laterality Date   A/V FISTULAGRAM N/A 06/28/2018   Procedure: A/V FISTULAGRAM;  Surgeon: Elam Dutch, MD;  Location: [facility];  Service: Cardiovascular;  Laterality: N/A;   A/V FISTULAGRAM Left 04/23/2019   Procedure: A/V FISTULAGRAM;  Surgeon: Marty Heck, MD;  Location: [facility];  Service: Cardiovascular;  Laterality: Left;   AV FISTULA PLACEMENT Right    AV FISTULA  PLACEMENT Left    this one never used   AV FISTULA PLACEMENT Left 08/28/2018   Procedure: Arteriovenous (Av) Fistula Creation;  Surgeon: Rosetta Posner, MD;  Location: [place];  Service: Vascular;  Laterality: Left;   [place] Left 11/11/2018   Procedure: SECOND STAGE BASILIC VEIN TRANSPOSITION LEFT ARM;  Surgeon: Rosetta Posner, MD;  Location: [place];  Service: Vascular;  Laterality: Left;   CARDIAC CATHETERIZATION     performed at the [facility] in [place]   COLONOSCOPY     FISTULOGRAM Right 06/11/2017   Procedure: FISTULOGRAM RIGHT ARTERIOVENOUS FISTULA WITH INTERVENTION;  Surgeon: Conrad Tecumseh, MD;  Location: [place];  Service: Vascular;  Laterality: Right;   INSERTION OF DIALYSIS CATHETER     IR GENERIC HISTORICAL  12/02/2015   IR FLUORO GUIDE CV LINE LEFT 12/02/2015 Jacqulynn Cadet, MD [facility]   IR GENERIC HISTORICAL  03/06/2016   IR REMOVAL TUN CV CATH W/O FL 03/06/2016 Markus Daft, MD [facility]   PERIPHERAL VASCULAR BALLOON ANGIOPLASTY  04/23/2019   Procedure: PERIPHERAL VASCULAR BALLOON ANGIOPLASTY;  Surgeon: Marty Heck, MD;  Location: [facility];  Service: Cardiovascular;;  Left AVF   TEMPORARY DIALYSIS CATHETER  06/28/2018   Procedure: TEMPORARY DIALYSIS CATHETER;  Surgeon: Elam Dutch, MD;  Location: [facility];  Service: Cardiovascular;;  Right Subclavian Dialysis catheter insertion  Social History   Socioeconomic History   Marital status: Married    Spouse name: Not on file   Number of children: Not on file   Years of education: Not on file   Highest education level: Not on file  Occupational History   Not on file  Tobacco Use   Smoking status: Never   Smokeless tobacco: Never  Vaping Use   Vaping Use: Never used  Substance and Sexual Activity   Alcohol use: No    Comment: years ago, none now   Drug use: No    Comment: years ago   Sexual activity: Not on file  Other Topics Concern   Not on file  Social History  Narrative   Not on file   Social Determinants of Health   Financial Resource Strain: Not on file  Food Insecurity: Not on file  Transportation Needs: Not on file  Physical Activity: Not on file  Stress: Not on file  Social Connections: Not on file  Intimate Partner Violence: Not on file   Family History  Problem Relation Age of Onset   Diabetes Mother    Heart disease Mother    Heart disease Father     No current facility-administered medications for this encounter.    No Known Allergies   REVIEW OF SYSTEMS:  [X]  denotes positive finding, [ ]  denotes negative finding Cardiac  Comments:  Chest pain or chest pressure:    Shortness of breath upon exertion:    Short of breath when lying flat:    Irregular heart rhythm:        Vascular    Pain in calf, thigh, or hip brought on by ambulation:    Pain in feet at night that wakes you up from your sleep:     Blood clot in your veins:    Leg swelling:         Pulmonary    Oxygen at home:    Productive cough:     Wheezing:         Neurologic    Sudden weakness in arms or legs:     Sudden numbness in arms or legs:     Sudden onset of difficulty speaking or slurred speech:    Temporary loss of vision in one eye:     Problems with dizziness:         Gastrointestinal    Blood in stool:     Vomited blood:         Genitourinary    Burning when urinating:     Blood in urine:        Psychiatric    Major depression:         Hematologic    Bleeding problems:    Problems with blood clotting too easily:        Skin    Rashes or ulcers:        Constitutional    Fever or chills:      PHYSICAL EXAMINATION:  Vitals:   09/28/21 0858  BP: (!) 157/83  Pulse: 69  Resp: 20  Temp: 98.3 F (36.8 C)  TempSrc: Oral  SpO2: 98%  Weight: 79.4 kg  Height: 5\' 11"  (1.803 m)    General:  WDWN in NAD; vital signs documented above Gait: Not observed HENT: WNL, normocephalic Pulmonary: normal non-labored breathing ,  without wheezing Cardiac: regular HR, Abdomen: soft, NT, no masses Skin: without rashes Vascular Exam/Pulses:  Right Left  Radial 2+ (normal) 2+ (normal)  Ulnar 2+ (normal) 2+ (normal)                   Extremities: Fistula with excellent thrill   Musculoskeletal: no muscle wasting or atrophy  Neurologic: A&O X 3;  No focal weakness or paresthesias are detected Psychiatric:  The pt has Normal affect.   Non-Invasive Vascular Imaging:   -    ASSESSMENT/PLAN: Kevin Walker is a 71 y.o. male presenting with left upper extremity swelling with known left-sided AV fistula.  I am concerned there could be an outflow stenosis which is hindering venous return and is leading to arm swelling.  Interestingly, there is severe, 3+ pitting edema in the arm, hand, fingers which is more consistent with lymphedema.  I had a long conversation with Kevin Walker regarding the above.  I think he would benefit from left upper extremity fistulogram to ensure there is no central stenosis leading to left upper extremity swelling.  I have also ordered him a compression sleeve and compression glove, and asked him to elevate his arm when possible to decrease the swelling in the left arm.  After discussing the risk and benefits of left arm fistulogram, Kevin Walker elected to proceed.   Broadus John, MD [facility] [phone]

## 2021-09-29 ENCOUNTER — Telehealth: Payer: Self-pay

## 2021-09-29 NOTE — Telephone Encounter (Signed)
Pt called regarding question about rx for J hook compression sleeve. Pt needs 15-20 compression, which [facility] states does not require a rx. I have tried several times to reach pt back but he has a VM that is full and I am unable to leave VM.

## 2021-10-26 ENCOUNTER — Other Ambulatory Visit: Payer: Self-pay | Admitting: *Deleted

## 2021-10-26 DIAGNOSIS — N186 End stage renal disease: Secondary | ICD-10-CM

## 2021-10-31 ENCOUNTER — Ambulatory Visit: Payer: Non-veteran care | Admitting: Neurology

## 2021-10-31 ENCOUNTER — Encounter: Payer: Self-pay | Admitting: Neurology

## 2021-11-04 ENCOUNTER — Encounter: Payer: Non-veteran care | Admitting: Vascular Surgery

## 2021-11-04 ENCOUNTER — Encounter (HOSPITAL_COMMUNITY): Payer: Non-veteran care

## 2021-11-08 ENCOUNTER — Encounter (HOSPITAL_COMMUNITY): Payer: Self-pay | Admitting: *Deleted

## 2021-11-08 ENCOUNTER — Emergency Department (HOSPITAL_COMMUNITY): Payer: No Typology Code available for payment source

## 2021-11-08 ENCOUNTER — Inpatient Hospital Stay (HOSPITAL_COMMUNITY)
Admission: EM | Admit: 2021-11-08 | Discharge: 2021-12-06 | DRG: 444 | Disposition: E | Payer: No Typology Code available for payment source | Attending: Family Medicine | Admitting: Family Medicine

## 2021-11-08 ENCOUNTER — Other Ambulatory Visit: Payer: Self-pay

## 2021-11-08 ENCOUNTER — Inpatient Hospital Stay (HOSPITAL_COMMUNITY): Payer: No Typology Code available for payment source

## 2021-11-08 DIAGNOSIS — I462 Cardiac arrest due to underlying cardiac condition: Secondary | ICD-10-CM | POA: Diagnosis not present

## 2021-11-08 DIAGNOSIS — D631 Anemia in chronic kidney disease: Secondary | ICD-10-CM | POA: Diagnosis present

## 2021-11-08 DIAGNOSIS — Z8249 Family history of ischemic heart disease and other diseases of the circulatory system: Secondary | ICD-10-CM

## 2021-11-08 DIAGNOSIS — K5900 Constipation, unspecified: Secondary | ICD-10-CM | POA: Diagnosis present

## 2021-11-08 DIAGNOSIS — E162 Hypoglycemia, unspecified: Secondary | ICD-10-CM | POA: Diagnosis present

## 2021-11-08 DIAGNOSIS — I214 Non-ST elevation (NSTEMI) myocardial infarction: Secondary | ICD-10-CM

## 2021-11-08 DIAGNOSIS — E860 Dehydration: Secondary | ICD-10-CM | POA: Diagnosis present

## 2021-11-08 DIAGNOSIS — I4901 Ventricular fibrillation: Secondary | ICD-10-CM | POA: Diagnosis not present

## 2021-11-08 DIAGNOSIS — K439 Ventral hernia without obstruction or gangrene: Secondary | ICD-10-CM | POA: Diagnosis present

## 2021-11-08 DIAGNOSIS — Z992 Dependence on renal dialysis: Secondary | ICD-10-CM

## 2021-11-08 DIAGNOSIS — D696 Thrombocytopenia, unspecified: Secondary | ICD-10-CM | POA: Diagnosis present

## 2021-11-08 DIAGNOSIS — I472 Ventricular tachycardia, unspecified: Secondary | ICD-10-CM | POA: Diagnosis not present

## 2021-11-08 DIAGNOSIS — K4020 Bilateral inguinal hernia, without obstruction or gangrene, not specified as recurrent: Secondary | ICD-10-CM | POA: Diagnosis present

## 2021-11-08 DIAGNOSIS — K8070 Calculus of gallbladder and bile duct without cholecystitis without obstruction: Principal | ICD-10-CM | POA: Diagnosis present

## 2021-11-08 DIAGNOSIS — K82A2 Perforation of gallbladder in cholecystitis: Secondary | ICD-10-CM | POA: Diagnosis present

## 2021-11-08 DIAGNOSIS — I959 Hypotension, unspecified: Secondary | ICD-10-CM | POA: Diagnosis present

## 2021-11-08 DIAGNOSIS — I10 Essential (primary) hypertension: Secondary | ICD-10-CM | POA: Diagnosis present

## 2021-11-08 DIAGNOSIS — K8050 Calculus of bile duct without cholangitis or cholecystitis without obstruction: Secondary | ICD-10-CM

## 2021-11-08 DIAGNOSIS — I509 Heart failure, unspecified: Secondary | ICD-10-CM | POA: Diagnosis present

## 2021-11-08 DIAGNOSIS — G4730 Sleep apnea, unspecified: Secondary | ICD-10-CM | POA: Diagnosis present

## 2021-11-08 DIAGNOSIS — J9621 Acute and chronic respiratory failure with hypoxia: Secondary | ICD-10-CM | POA: Diagnosis not present

## 2021-11-08 DIAGNOSIS — Z87442 Personal history of urinary calculi: Secondary | ICD-10-CM

## 2021-11-08 DIAGNOSIS — K219 Gastro-esophageal reflux disease without esophagitis: Secondary | ICD-10-CM | POA: Diagnosis present

## 2021-11-08 DIAGNOSIS — Z79899 Other long term (current) drug therapy: Secondary | ICD-10-CM | POA: Diagnosis not present

## 2021-11-08 DIAGNOSIS — Z833 Family history of diabetes mellitus: Secondary | ICD-10-CM

## 2021-11-08 DIAGNOSIS — I132 Hypertensive heart and chronic kidney disease with heart failure and with stage 5 chronic kidney disease, or end stage renal disease: Secondary | ICD-10-CM | POA: Diagnosis present

## 2021-11-08 DIAGNOSIS — K7031 Alcoholic cirrhosis of liver with ascites: Secondary | ICD-10-CM | POA: Diagnosis present

## 2021-11-08 DIAGNOSIS — R1114 Bilious vomiting: Secondary | ICD-10-CM | POA: Diagnosis present

## 2021-11-08 DIAGNOSIS — N186 End stage renal disease: Secondary | ICD-10-CM | POA: Diagnosis present

## 2021-11-08 DIAGNOSIS — I4891 Unspecified atrial fibrillation: Secondary | ICD-10-CM | POA: Diagnosis present

## 2021-11-08 DIAGNOSIS — F4310 Post-traumatic stress disorder, unspecified: Secondary | ICD-10-CM | POA: Diagnosis present

## 2021-11-08 DIAGNOSIS — K429 Umbilical hernia without obstruction or gangrene: Secondary | ICD-10-CM | POA: Diagnosis present

## 2021-11-08 LAB — CBC WITH DIFFERENTIAL/PLATELET
Abs Immature Granulocytes: 0.02 10*3/uL (ref 0.00–0.07)
Basophils Absolute: 0 10*3/uL (ref 0.0–0.1)
Basophils Relative: 0 %
Eosinophils Absolute: 0 10*3/uL (ref 0.0–0.5)
Eosinophils Relative: 0 %
HCT: 26.5 % — ABNORMAL LOW (ref 39.0–52.0)
Hemoglobin: 8.9 g/dL — ABNORMAL LOW (ref 13.0–17.0)
Immature Granulocytes: 0 %
Lymphocytes Relative: 1 %
Lymphs Abs: 0.1 10*3/uL — ABNORMAL LOW (ref 0.7–4.0)
MCH: 27.6 pg (ref 26.0–34.0)
MCHC: 33.6 g/dL (ref 30.0–36.0)
MCV: 82 fL (ref 80.0–100.0)
Monocytes Absolute: 0.2 10*3/uL (ref 0.1–1.0)
Monocytes Relative: 3 %
Neutro Abs: 6.5 10*3/uL (ref 1.7–7.7)
Neutrophils Relative %: 96 %
Platelets: 111 10*3/uL — ABNORMAL LOW (ref 150–400)
RBC: 3.23 MIL/uL — ABNORMAL LOW (ref 4.22–5.81)
RDW: 14.6 % (ref 11.5–15.5)
WBC: 6.8 10*3/uL (ref 4.0–10.5)
nRBC: 0 % (ref 0.0–0.2)

## 2021-11-08 LAB — BLOOD GAS, ARTERIAL
Acid-base deficit: 13.1 mmol/L — ABNORMAL HIGH (ref 0.0–2.0)
Bicarbonate: 15.8 mmol/L — ABNORMAL LOW (ref 20.0–28.0)
Drawn by: 21310
FIO2: 100 %
O2 Saturation: 100 %
Patient temperature: 36.7
pCO2 arterial: 50 mmHg — ABNORMAL HIGH (ref 32–48)
pH, Arterial: 7.1 — CL (ref 7.35–7.45)
pO2, Arterial: 232 mmHg — ABNORMAL HIGH (ref 83–108)

## 2021-11-08 LAB — CBG MONITORING, ED
Glucose-Capillary: 21 mg/dL — CL (ref 70–99)
Glucose-Capillary: 22 mg/dL — CL (ref 70–99)
Glucose-Capillary: 53 mg/dL — ABNORMAL LOW (ref 70–99)
Glucose-Capillary: 48 mg/dL — ABNORMAL LOW (ref 70–99)
Glucose-Capillary: 74 mg/dL (ref 70–99)
Glucose-Capillary: 72 mg/dL (ref 70–99)
Glucose-Capillary: 62 mg/dL — ABNORMAL LOW (ref 70–99)
Glucose-Capillary: 85 mg/dL (ref 70–99)

## 2021-11-08 LAB — COMPREHENSIVE METABOLIC PANEL
ALT: 27 U/L (ref 0–44)
AST: 31 U/L (ref 15–41)
Albumin: 2.4 g/dL — ABNORMAL LOW (ref 3.5–5.0)
Alkaline Phosphatase: 49 U/L (ref 38–126)
Anion gap: 12 (ref 5–15)
BUN: 68 mg/dL — ABNORMAL HIGH (ref 8–23)
CO2: 28 mmol/L (ref 22–32)
Calcium: 8.6 mg/dL — ABNORMAL LOW (ref 8.9–10.3)
Chloride: 95 mmol/L — ABNORMAL LOW (ref 98–111)
Creatinine, Ser: 6.58 mg/dL — ABNORMAL HIGH (ref 0.61–1.24)
GFR, Estimated: 8 mL/min — ABNORMAL LOW (ref >60)
Glucose, Bld: 31 mg/dL — CL (ref 70–99)
Potassium: 3.5 mmol/L (ref 3.5–5.1)
Sodium: 135 mmol/L (ref 135–145)
Total Bilirubin: 2.6 mg/dL — ABNORMAL HIGH (ref 0.3–1.2)
Total Protein: 6.9 g/dL (ref 6.5–8.1)

## 2021-11-08 LAB — I-STAT CHEM 8, ED
BUN: 64 mg/dL — ABNORMAL HIGH (ref 8–23)
Calcium, Ion: 1.16 mmol/L (ref 1.15–1.40)
Chloride: 98 mmol/L (ref 98–111)
Creatinine, Ser: 6.5 mg/dL — ABNORMAL HIGH (ref 0.61–1.24)
Glucose, Bld: 121 mg/dL — ABNORMAL HIGH (ref 70–99)
HCT: 28 % — ABNORMAL LOW (ref 39.0–52.0)
Hemoglobin: 9.5 g/dL — ABNORMAL LOW (ref 13.0–17.0)
Potassium: 3.7 mmol/L (ref 3.5–5.1)
Sodium: 137 mmol/L (ref 135–145)
TCO2: 24 mmol/L (ref 22–32)

## 2021-11-08 LAB — LACTIC ACID, PLASMA: Lactic Acid, Venous: 1.3 mmol/L (ref 0.5–1.9)

## 2021-11-08 LAB — TROPONIN I (HIGH SENSITIVITY)
Troponin I (High Sensitivity): 634 ng/L (ref <18)
Troponin I (High Sensitivity): 572 ng/L (ref <18)

## 2021-11-08 LAB — LIPASE, BLOOD: Lipase: 17 U/L (ref 11–51)

## 2021-11-08 LAB — HEMOGLOBIN A1C
Hgb A1c MFr Bld: 4 % — ABNORMAL LOW (ref 4.8–5.6)
Mean Plasma Glucose: 68.1 mg/dL

## 2021-11-08 LAB — ACETAMINOPHEN LEVEL: Acetaminophen (Tylenol), Serum: <10 ug/mL — ABNORMAL LOW (ref 10–30)

## 2021-11-08 LAB — AMMONIA: Ammonia: 30 umol/L (ref 9–35)

## 2021-11-08 MED ORDER — PANTOPRAZOLE SODIUM 40 MG IV SOLR
40.0000 mg | INTRAVENOUS | Status: DC
  Administered 2021-11-08: 40 mg via INTRAVENOUS
  Filled 2021-11-08: qty 10

## 2021-11-08 MED ORDER — AMIODARONE HCL IN DEXTROSE 360-4.14 MG/200ML-% IV SOLN
INTRAVENOUS | Status: AC
  Filled 2021-11-08: qty 200

## 2021-11-08 MED ORDER — SEVELAMER CARBONATE 800 MG PO TABS: 800.0000 mg | ORAL_TABLET | Freq: Three times a day (TID) | ORAL | Status: DC

## 2021-11-08 MED ORDER — EPINEPHRINE HCL 5 MG/250ML IV SOLN IN NS: 0.5000 ug/min | INTRAVENOUS | Status: DC

## 2021-11-08 MED ORDER — ATORVASTATIN CALCIUM 10 MG PO TABS: 10.0000 mg | ORAL_TABLET | Freq: Every day | ORAL | Status: DC

## 2021-11-08 MED ORDER — NOREPINEPHRINE 4 MG/250ML-% IV SOLN
0.0000 ug/min | INTRAVENOUS | Status: DC
  Administered 2021-11-08: 20 ug/min via INTRAVENOUS

## 2021-11-08 MED ORDER — LATANOPROST 0.005 % OP SOLN
1.0000 [drp] | Freq: Every day | OPHTHALMIC | Status: DC
Start: 2021-11-08 — End: 2021-11-09

## 2021-11-08 MED ORDER — AMIODARONE IV BOLUS ONLY 150 MG/100ML
INTRAVENOUS | Status: AC
  Filled 2021-11-08: qty 100

## 2021-11-08 MED ORDER — SODIUM CHLORIDE 0.9% FLUSH: 3.0000 mL | Freq: Two times a day (BID) | INTRAVENOUS | Status: DC

## 2021-11-08 MED ORDER — TRAZODONE HCL 50 MG PO TABS: 100.0000 mg | ORAL_TABLET | Freq: Every day | ORAL | Status: DC

## 2021-11-08 MED ORDER — DEXTROSE 50 % IV SOLN: 1.0000 | Freq: Once | INTRAVENOUS | Status: AC

## 2021-11-08 MED ORDER — PANTOPRAZOLE 80MG IVPB - SIMPLE MED: 80.0000 mg | Freq: Once | INTRAVENOUS | Status: DC

## 2021-11-08 MED ORDER — DEXTROSE 50 % IV SOLN
50.0000 mL | Freq: Once | INTRAVENOUS | Status: AC
  Administered 2021-11-08: 50 mL via INTRAVENOUS

## 2021-11-08 MED ORDER — AMIODARONE HCL IN DEXTROSE 360-4.14 MG/200ML-% IV SOLN: 30.0000 mg/h | INTRAVENOUS | Status: DC

## 2021-11-08 MED ORDER — DEXTROSE 10 % IV SOLN: Freq: Once | INTRAVENOUS | Status: AC

## 2021-11-08 MED ORDER — ACETAMINOPHEN 325 MG PO TABS: 650.0000 mg | ORAL_TABLET | Freq: Four times a day (QID) | ORAL | Status: DC | PRN

## 2021-11-08 MED ORDER — HEPARIN (PORCINE) 25000 UT/250ML-% IV SOLN: 900.0000 [IU]/h | INTRAVENOUS | Status: DC

## 2021-11-08 MED ORDER — BISACODYL 10 MG RE SUPP: 10.0000 mg | Freq: Every day | RECTAL | Status: DC | PRN

## 2021-11-08 MED ORDER — CHLORHEXIDINE GLUCONATE CLOTH 2 % EX PADS: 6.0000 | MEDICATED_PAD | Freq: Every day | CUTANEOUS | Status: DC

## 2021-11-08 MED ORDER — ONDANSETRON HCL 4 MG/2ML IJ SOLN: 4.0000 mg | Freq: Four times a day (QID) | INTRAMUSCULAR | Status: DC | PRN

## 2021-11-08 MED ORDER — HEPARIN BOLUS VIA INFUSION: 4000.0000 [IU] | Freq: Once | INTRAVENOUS | Status: DC

## 2021-11-08 MED ORDER — FENTANYL CITRATE PF 50 MCG/ML IJ SOSY
25.0000 ug | PREFILLED_SYRINGE | Freq: Once | INTRAMUSCULAR | Status: AC
  Administered 2021-11-08: 25 ug via INTRAVENOUS
  Filled 2021-11-08: qty 1

## 2021-11-08 MED ORDER — DEXTROSE 50 % IV SOLN
INTRAVENOUS | Status: AC
  Administered 2021-11-08: 50 mL via INTRAVENOUS
  Filled 2021-11-08: qty 50

## 2021-11-08 MED ORDER — HEPARIN BOLUS VIA INFUSION
4000.0000 [IU] | Freq: Once | INTRAVENOUS | Status: AC
  Administered 2021-11-08: 4000 [IU] via INTRAVENOUS

## 2021-11-08 MED ORDER — CALCITRIOL 0.25 MCG PO CAPS: 0.2500 ug | ORAL_CAPSULE | ORAL | Status: DC

## 2021-11-08 MED ORDER — SODIUM CHLORIDE 0.9% FLUSH: 3.0000 mL | INTRAVENOUS | Status: DC | PRN

## 2021-11-08 MED ORDER — AMIODARONE HCL IN DEXTROSE 360-4.14 MG/200ML-% IV SOLN
60.0000 mg/h | INTRAVENOUS | Status: DC
  Administered 2021-11-08: 60 mg/h via INTRAVENOUS

## 2021-11-08 MED ORDER — EPINEPHRINE HCL 5 MG/250ML IV SOLN IN NS
INTRAVENOUS | Status: AC
  Administered 2021-11-08: 20 ug/min via INTRAVENOUS
  Filled 2021-11-08: qty 250

## 2021-11-08 MED ORDER — HYDROCODONE-ACETAMINOPHEN 5-325 MG PO TABS: 1.0000 | ORAL_TABLET | Freq: Two times a day (BID) | ORAL | Status: DC | PRN

## 2021-11-08 MED ORDER — LACTATED RINGERS IV BOLUS
500.0000 mL | Freq: Once | INTRAVENOUS | Status: AC
  Administered 2021-11-08: 500 mL via INTRAVENOUS

## 2021-11-08 MED ORDER — ACETAMINOPHEN 650 MG RE SUPP: 650.0000 mg | Freq: Four times a day (QID) | RECTAL | Status: DC | PRN

## 2021-11-08 MED ORDER — GLUCOSE 40 % PO GEL
1.0000 | Freq: Once | ORAL | Status: AC
  Administered 2021-11-08: 31 g via ORAL

## 2021-11-08 MED ORDER — SODIUM CHLORIDE 0.9 % IV BOLUS: 1000.0000 mL | Freq: Once | INTRAVENOUS | Status: DC

## 2021-11-08 MED ORDER — ASPIRIN 81 MG PO CHEW
324.0000 mg | CHEWABLE_TABLET | Freq: Once | ORAL | Status: AC
Start: 2021-11-08 — End: 2021-11-08
  Administered 2021-11-08: 324 mg via ORAL
  Filled 2021-11-08: qty 4

## 2021-11-08 MED ORDER — SUCCINYLCHOLINE CHLORIDE 200 MG/10ML IV SOSY
PREFILLED_SYRINGE | INTRAVENOUS | Status: AC
  Filled 2021-11-08: qty 10

## 2021-11-08 MED ORDER — POLYETHYLENE GLYCOL 3350 17 G PO PACK: 17.0000 g | PACK | Freq: Every day | ORAL | Status: DC

## 2021-11-08 MED ORDER — DEXTROSE 10 % IV SOLN: INTRAVENOUS | Status: DC

## 2021-11-08 MED ORDER — LIDOCAINE-PRILOCAINE 2.5-2.5 % EX CREA: 1.0000 | TOPICAL_CREAM | CUTANEOUS | Status: DC | PRN

## 2021-11-08 MED ORDER — PIPERACILLIN-TAZOBACTAM 3.375 G IVPB 30 MIN
3.3750 g | Freq: Once | INTRAVENOUS | Status: AC
  Administered 2021-11-08: 3.375 g via INTRAVENOUS
  Filled 2021-11-08: qty 50

## 2021-11-08 MED ORDER — ONDANSETRON HCL 4 MG PO TABS: 4.0000 mg | ORAL_TABLET | Freq: Four times a day (QID) | ORAL | Status: DC | PRN

## 2021-11-08 MED ORDER — SODIUM CHLORIDE 0.9 % IV SOLN: INTRAVENOUS | Status: DC | PRN

## 2021-11-08 MED ORDER — SODIUM BICARBONATE 8.4 % IV SOLN: 100.0000 meq | Freq: Once | INTRAVENOUS | Status: DC

## 2021-11-08 MED ORDER — HEPARIN (PORCINE) 25000 UT/250ML-% IV SOLN
900.0000 [IU]/h | INTRAVENOUS | Status: DC
  Administered 2021-11-08: 900 [IU]/h via INTRAVENOUS
  Filled 2021-11-08: qty 250

## 2021-11-08 MED ORDER — PIPERACILLIN-TAZOBACTAM 3.375 G IVPB: 3.3750 g | Freq: Two times a day (BID) | INTRAVENOUS | Status: DC

## 2021-11-08 MED ORDER — ETOMIDATE 2 MG/ML IV SOLN
INTRAVENOUS | Status: AC
  Filled 2021-11-08: qty 20

## 2021-11-08 MED ORDER — SODIUM CHLORIDE 0.9 % IV BOLUS
1000.0000 mL | Freq: Once | INTRAVENOUS | Status: AC
  Administered 2021-11-08: 1000 mL via INTRAVENOUS

## 2021-11-08 MED ORDER — FENTANYL CITRATE PF 50 MCG/ML IJ SOSY: 25.0000 ug | PREFILLED_SYRINGE | INTRAMUSCULAR | Status: DC | PRN

## 2021-11-09 NOTE — ED Notes (Signed)
Family was given pt's shoes, glasses and cell phone in belongings bag

## 2021-12-06 NOTE — ED Notes (Signed)
ED Provider at bedside. 

## 2021-12-06 NOTE — H&P (Signed)
Patient Demographics:    Kevin Walker, is a 71 y.o. male  MRN: [id number]   DOB - [birth year]  Admit Date - 11/29/2021  Outpatient Primary MD for the patient is Clinic, [place]:   Assessment and Plan:  1) cholelithiasis with choledocholithiasis----?? gallbladder wall perforation --CT abdomen and pelvis with cholelithiasis with choledocholithiasis with dilatation of the CBD -Unable to rule out possible gallbladder wall leak/perforation I called and discussed this case with on-call GI physician Dr. Carlean Purl... He requested that he be notified when patient arrives at [facility] to do a formal consult -Right upper quadrant ultrasound ordered -Patient may need MRCP prior to determination of ERCP --T. bili 2.6 otherwise LFTs not elevated,  -Lactic acid is not elevated -IV Zosyn for now due to inability to rule out perforated gallbladder wall/intra-abdominal infection  2)Elevated troponin--- possible NSTEMI -Remains chest pain-free,  no pleuritic symptoms --Troponin 634>>572 -Serial EKG with possible A-fib but no acute ST changes -Echo requested -IV heparin initiated -Please get official cardiology consult when patient arrives at [facility]--- given other comorbidities and overall condition may not be a candidate for intervention at this time -Medical management for now--aspirin , metoprolol and Lipitor as ordered  3)ESRD--TTS schedule--- on HD since 2009 -Last full HD was 11/01/2021, patient had partial HD on 11/03/2021 -Dr. Otelia Santee from nephrology service notified -Please let him know when patient gets to [facility] --anticipate HD on 12-01-21 off schedule -Potassium WNL -Continue Calcitrol and Renvela  4)Persistent hypoglycemia--- most likely due to poor oral intake -serum glucose  was 31 -Upon EMS arrival at patient's residence is blood sugar apparently was 52 despite interventions his blood sugar went down to 32,... Further interventions were administered by EMS crew and blood sugar remained in the 50s -Patient has no diagnosis of diabetes, he does not take insulin -In the ED his blood sugar was down to 22... P -Continue IV dextrose until oral intake is more reliable -Frequent fingerstick checks  5) chronic anemia of ESRD --Hgb 8.9 which is not far from his baseline -Watch Hgb closely with IV heparin running -Defer decision on ESA/Procrit to nephrology service  6) chronic thrombocytopenia---, platelets 111, suspect related to underlying alcoholic liver cirrhosis -Watch platelets closely while on IV heparin  7)Hernias---  previously known ventral hernia and bilateral inguinal hernias -Given significant comorbidities probably not a candidate for hernia repair unless bowel obstruction/incarceration  8) chronic hypoxic respiratory failure--- chronically on 2 L of oxygen via nasal cannula for the last few months -Patient is a non-smoker but he has had recurrent pleural effusions requiring interventions  9) alcoholic liver cirrhosis with ascites--- defer decision on possible therapeutic/palliative paracentesis to GI service  10)HTN--BP soft suspect due to dehydration in the setting of poor oral intake -Hold amlodipine and hold atenolol -BP may improve with dextrose IV fluids  11) social/ethics--- plan of care, CODE STATUS and advanced directive discussed with patient and  patient's daughter [name] at bedside -They request full CODE STATUS  Disposition/Need for in-Hospital Stay- patient unable to be discharged at this time due to -transfer to [facility] for GI consult and possible ERCP, patient will need nephrology and cardiology input as well*  Status is: Inpatient  Remains inpatient appropriate because:   Dispo: The patient is from: Home              Anticipated  d/c is to: Home              Anticipated d/c date is: 3 days              Patient currently is not medically stable to d/c. Barriers: Not Clinically Stable-   With History of - Reviewed by me  Past Medical History:  Diagnosis Date   Anemia    low iron   CHF (congestive heart failure) (HCC)    Chronic kidney disease    Dialysis T/Th/Sa   Dyspnea    on exertion   GERD (gastroesophageal reflux disease)    Heart murmur    Hernia, abdominal    History of kidney stones    Hypertension    PTSD (post-traumatic stress disorder)    Sleep apnea    has a cpap - has not used it in past, but starting soon      Past Surgical History:  Procedure Laterality Date   A/V FISTULAGRAM N/A 06/28/2018   Procedure: A/V FISTULAGRAM;  Surgeon: Elam Dutch, MD;  Location: [place] [facility];  Service: Cardiovascular;  Laterality: N/A;   A/V FISTULAGRAM Left 04/23/2019   Procedure: A/V FISTULAGRAM;  Surgeon: Marty Heck, MD;  Location: [place] [facility];  Service: Cardiovascular;  Laterality: Left;   AV FISTULA PLACEMENT Right    AV FISTULA PLACEMENT Left    this one never used   AV FISTULA PLACEMENT Left 08/28/2018   Procedure: Arteriovenous (Av) Fistula Creation;  Surgeon: Rosetta Posner, MD;  Location: [place];  Service: Vascular;  Laterality: Left;   [place] Left 11/11/2018   Procedure: SECOND STAGE BASILIC VEIN TRANSPOSITION LEFT ARM;  Surgeon: Rosetta Posner, MD;  Location: [place];  Service: Vascular;  Laterality: Left;   CARDIAC CATHETERIZATION     performed at the [facility] in [place]   COLONOSCOPY     FISTULOGRAM Right 06/11/2017   Procedure: FISTULOGRAM RIGHT ARTERIOVENOUS FISTULA WITH INTERVENTION;  Surgeon: Conrad Woodbury, MD;  Location: [place];  Service: Vascular;  Laterality: Right;   INSERTION OF DIALYSIS CATHETER     IR GENERIC HISTORICAL  12/02/2015   IR FLUORO GUIDE CV LINE LEFT 12/02/2015 Jacqulynn Cadet, MD [facility]-INTERV RAD   IR GENERIC HISTORICAL  03/06/2016    IR REMOVAL TUN CV CATH W/O FL 03/06/2016 Markus Daft, MD [facility]-INTERV RAD   PERIPHERAL VASCULAR BALLOON ANGIOPLASTY  04/23/2019   Procedure: PERIPHERAL VASCULAR BALLOON ANGIOPLASTY;  Surgeon: Marty Heck, MD;  Location: [place] [facility];  Service: Cardiovascular;;  Left AVF   PERIPHERAL VASCULAR BALLOON ANGIOPLASTY  09/28/2021   Procedure: PERIPHERAL VASCULAR BALLOON ANGIOPLASTY;  Surgeon: Broadus John, MD;  Location: [place] [facility];  Service: Cardiovascular;;   PERIPHERAL VASCULAR THROMBECTOMY  09/28/2021   Procedure: PERIPHERAL VASCULAR THROMBECTOMY;  Surgeon: Broadus John, MD;  Location: [place] [facility];  Service: Cardiovascular;;  Left AVF   TEMPORARY DIALYSIS CATHETER  06/28/2018   Procedure: TEMPORARY DIALYSIS CATHETER;  Surgeon: Elam Dutch, MD;  Location: [place]  [facility];  Service: Cardiovascular;;  Right Subclavian Dialysis catheter insertion    Chief Complaint  Patient presents with   Abdominal Pain      HPI:    Kevin Walker  is a 71 y.o. male reformed alcoholic (quit drinking around 2009) with past medical history relevant for alcoholic liver cirrhosis with ascites, ESRD on hemodialysis since 2009 (TTS schedule)-, anemia of ESRD, HTN with previously known ventral hernia..  Presented to the ED with lack of BM for almost a week, poor oral intake, nausea, and emesis  -Additional history obtained from patient's daughter [name] at bedside MS arrival at patient's residence is blood sugar apparently was 52 despite interventions his blood sugar went down to 32,... Further interventions were administered by EMS crew and blood sugar remained in the 50s -Patient has no diagnosis of diabetes, he does not take insulin -In the ED his blood sugar was down to 22... Patient had nausea and felt uncomfortable -Patient denies chest pains, no pleuritic symptoms No fever  Or chills  No productive cough no leg pains or leg swelling -Patient states numbing feeling well  last full HD was 11/01/2021, patient had partial HD on 11/03/2021  -In the ED--- serum glucose was 31, creatinine 6.58, bicarb 28 potassium 3.5 -T. bili 2.6 otherwise LFTs not elevated, anion gap 12 -Lipase and lactic acid not elevated -Hgb 8.9 which is not far from his baseline, platelets 111, WBC 6.8 -Troponin 634>>572 -Severe EKG with possible A-fib but no acute ST changes -CT abdomen and pelvis with cholelithiasis with choledocholithiasis with dilatation of the CBD -Unable to rule out possible gallbladder wall leak/perforation --Liver cirrhosis with ascites noted bilateral groin hernias and ventral umbilical hernia noted   Review of systems:    In addition to the HPI above,   A full Review of  Systems was done, all other systems reviewed are negative except as noted above in HPI , .   Social History:  Reviewed by me    Social History   Tobacco Use   Smoking status: Never   Smokeless tobacco: Never  Substance Use Topics   Alcohol use: No    Comment: years ago, none now     Family History :  Reviewed by me    Family History  Problem Relation Age of Onset   Diabetes Mother    Heart disease Mother    Heart disease Father      Home Medications:   Prior to Admission medications   Medication Sig Start Date End Date Taking? Authorizing Provider  amLODipine (NORVASC) 10 MG tablet Take 10 mg by mouth daily in the afternoon.    Yes [provider]  B Complex-C-Folic Acid (DIALYVITE PO) Take 1 tablet by mouth daily in the afternoon.    Yes [provider]  bisacodyl (DULCOLAX) 5 MG EC tablet Take 5 mg by mouth daily in the afternoon.    Yes [provider]  bismuth subsalicylate (PEPTO BISMOL) 262 MG/15ML suspension Take 30 mLs by mouth every 6 (six) hours as needed for indigestion.   Yes [provider]  calcitRIOL (ROCALTROL) 0.25 MCG capsule Take 0.25 mcg by mouth 2 (two) times a week.   Yes [provider]  febuxostat (ULORIC)  40 MG tablet Take 40 mg by mouth daily as needed (gout flare).   Yes [provider]  fluticasone (FLONASE) 50 MCG/ACT nasal spray Place 2 sprays into both nostrils daily.   Yes [provider]  furosemide (LASIX) 80 MG  tablet Take 40 mg by mouth daily in the afternoon.   Yes [provider]  HYDROcodone-acetaminophen (NORCO) 7.5-325 MG tablet Take 1 tablet by mouth 2 (two) times daily as needed for moderate pain.   Yes [provider]  latanoprost (XALATAN) 0.005 % ophthalmic solution Place 1 drop into both eyes at bedtime.   Yes [provider]  lidocaine-prilocaine (EMLA) cream Apply 1 application topically as needed (for access).   Yes [provider]  naloxone (NARCAN) nasal spray 4 mg/0.1 mL Place 1 spray into the nose as needed (opioid overdose).   Yes [provider]  omeprazole (PRILOSEC) 20 MG capsule Take 20 mg by mouth 2 (two) times daily with a meal.   Yes [provider]  Polyvinyl Alcohol-Povidone (REFRESH OP) Place 1 drop into both eyes 2 (two) times a day.    Yes [provider]  sevelamer carbonate (RENVELA) 800 MG tablet Take 800 mg by mouth 3 (three) times daily with meals.   Yes [provider]  traZODone (DESYREL) 100 MG tablet Take 100 mg by mouth at bedtime.   Yes [provider]  atenolol (TENORMIN) 50 MG tablet Take 50 mg by mouth daily in the afternoon.    [provider]  Elastic Bandages & Supports (COMPRESSION & SUPPORT GLOVES L) MISC 2 Devices by Does not apply route daily. 09/16/21   Broadus John, MD  Elastic Bandages & Supports (J-HOOK ARMSLEEVE) MISC 2 Devices by Does not apply route daily. 09/16/21   Broadus John, MD     Allergies:    No Known Allergies   Physical Exam:   Vitals  Blood pressure 94/67, pulse 86, temperature 98.6 F (37 C), temperature source Oral, resp. rate (!) 22, height 5\' 11"  (1.803 m), weight 79.4 kg, SpO2 94 %.  Physical  Examination: General appearance - alert,  in no distress, chronically ill, frail and cachectic appearing Mental status - alert, oriented to person, place, and time,  Eyes - sclera anicteric Neck - supple, no JVD elevation , Chest -initial breath sounds,, no wheezing  heart - S1 and S2 normal, irregular Abdomen - soft, generalized abdominal tenderness, umbilical hernia noted, bilateral inguinal hernias, ascites with abdominal distention, +BS Neurological - screening mental status exam normal, neck supple without rigidity, cranial nerves II through XII intact, DTR's normal and symmetric Extremities - no pedal edema noted, intact peripheral pulses  Skin - warm, dry     Data Review:    CBC Recent Labs  Lab 11/27/2021 1351  WBC 6.8  HGB 8.9*  HCT 26.5*  PLT 111*  MCV 82.0  MCH 27.6  MCHC 33.6  RDW 14.6  LYMPHSABS 0.1*  MONOABS 0.2  EOSABS 0.0  BASOSABS 0.0   ------------------------------------------------------------------------------------------------------------------  Chemistries  Recent Labs  Lab 11/21/2021 1351  NA 135  K 3.5  CL 95*  CO2 28  GLUCOSE 31*  BUN 68*  CREATININE 6.58*  CALCIUM 8.6*  AST 31  ALT 27  ALKPHOS 49  BILITOT 2.6*   ------------------------------------------------------------------------------------------------------------------ estimated creatinine clearance is 11 mL/min (A) (by C-G formula based on SCr of 6.58 mg/dL (H)). ------------------------------------------------------------------------------------------------------------------ No results for input(s): "TSH", "T4TOTAL", "T3FREE", "THYROIDAB" in the last 72 hours.  Invalid input(s): "FREET3"   Coagulation profile No results for input(s): "INR", "PROTIME" in the last 168 hours. ------------------------------------------------------------------------------------------------------------------- No results for input(s): "DDIMER" in the last 72  hours. -------------------------------------------------------------------------------------------------------------------  Cardiac Enzymes No results for input(s): "CKMB", "TROPONINI", "MYOGLOBIN" in the last 168 hours.  Invalid input(s): "CK" ------------------------------------------------------------------------------------------------------------------ No results found for: "BNP"   ---------------------------------------------------------------------------------------------------------------  Urinalysis No results found for: "COLORURINE", "APPEARANCEUR", "LABSPEC", "PHURINE", "GLUCOSEU", "HGBUR", "BILIRUBINUR", "KETONESUR", "PROTEINUR", "UROBILINOGEN", "NITRITE", "LEUKOCYTESUR"  ----------------------------------------------------------------------------------------------------------------   Imaging Results:    CT ABDOMEN PELVIS WO CONTRAST  Result Date: 11/10/2021 CLINICAL DATA:  Abdominal pain for 4 days. EXAM: CT ABDOMEN AND PELVIS WITHOUT CONTRAST TECHNIQUE: Multidetector CT imaging of the abdomen and pelvis was performed following the standard protocol without IV contrast. RADIATION DOSE REDUCTION: This exam was performed according to the departmental dose-optimization program which includes automated exposure control, adjustment of the mA and/or kV according to patient size and/or use of iterative reconstruction technique. COMPARISON:  PET-CT 10/17/2019 FINDINGS: Lower chest: Chronic consolidative opacity in the right base with similar appearance of the. Chronic thick walled inferior right pleural fluid collection. Fluid in the esophagus is compatible with reflux or esophageal dysmotility. Heart is markedly enlarged. Bilateral gynecomastia. Hepatobiliary: Nodular liver contour is compatible cirrhosis. 2.3 cm stone in the right upper quadrant in the region the gallbladder fossa is probably a gallstone although gallbladder is not well demonstrated due to the large volume ascites, lack  of intra-abdominal fat, and lack of intravenous contrast material. Again, although not well demonstrated there may be a defect in the anterior gallbladder wall within adjacent fluid collection anterior to the gallbladder (see axial image 57 of series 2. Another 1.7 cm stone is identified towards the midline compatible with distal common bile duct stone (58/2). Common bile duct is dilated up to 14 mm diameter. Pancreas: No focal mass lesion. No dilatation of the main duct. No intraparenchymal cyst. No peripancreatic edema. Spleen: No splenomegaly. No focal mass lesion. Adrenals/Urinary Tract: No adrenal nodule or mass. Left kidney atrophic with cystic disease. Large cystic mass in the right abdomen is similar to prior, likely a markedly hydronephrotic left kidney with overlying cortical thinning. Bladder is nondistended. Stomach/Bowel: Stomach is distended with fluid and contrast material. No findings to suggest small bowel obstruction. Moderate stool volume throughout the colon. Vascular/Lymphatic: There is advanced atherosclerotic calcification of the abdominal aorta without aneurysm. There is no gastrohepatic or hepatoduodenal ligament lymphadenopathy. No retroperitoneal or mesenteric lymphadenopathy. No pelvic sidewall lymphadenopathy. Reproductive: Unremarkable. Other: Large volume ascites. Supraumbilical ventral hernia contains fluid. Patient has large bilateral groin hernias. The right-sided groin hernia contains distal small bowel, proximal colon and ascites. Left groin hernia contains small bowel and ascites. Musculoskeletal: No worrisome lytic or sclerotic osseous abnormality. IMPRESSION: 1. Large volume ascites. 2. Cholelithiasis with a 2.3 cm gallstone in the right upper quadrant gallbladder is not well demonstrated due to the large volume ascites, lack of intra-abdominal fat, and lack of intravenous contrast material, but there may be a defect in the anterior gallbladder wall with a irregular fluid  collection just anterior to the gallbladder raising the question of but not definitive for gallbladder wall leak/perforation. Another 1.7 cm stone is identified in the distal common bile duct stone with common bile duct dilatation up to 14 mm diameter. Right upper quadrant ultrasound may prove helpful to further evaluate the gallbladder. 3. Large bilateral groin hernias containing bowel loops and ascites. Supraumbilical midline ventral hernia contains fluid. 4. Nodular liver contour compatible with cirrhosis. 5. Chronic consolidative opacity in the right base with thick walled inferior right pleural fluid collection. Appearance is similar to PET-CT of 10/17/2019. 6. Markedly hydronephrotic left kidney with overlying cortical thinning. Left renal atrophy. 7. Aortic Atherosclerosis (ICD10-I70.0). Electronically Signed   By: Verda Cumins.D.  On: 11/17/2021 16:36    Radiological Exams on Admission: CT ABDOMEN PELVIS WO CONTRAST  Result Date: 11/12/2021 CLINICAL DATA:  Abdominal pain for 4 days. EXAM: CT ABDOMEN AND PELVIS WITHOUT CONTRAST TECHNIQUE: Multidetector CT imaging of the abdomen and pelvis was performed following the standard protocol without IV contrast. RADIATION DOSE REDUCTION: This exam was performed according to the departmental dose-optimization program which includes automated exposure control, adjustment of the mA and/or kV according to patient size and/or use of iterative reconstruction technique. COMPARISON:  PET-CT 10/17/2019 FINDINGS: Lower chest: Chronic consolidative opacity in the right base with similar appearance of the. Chronic thick walled inferior right pleural fluid collection. Fluid in the esophagus is compatible with reflux or esophageal dysmotility. Heart is markedly enlarged. Bilateral gynecomastia. Hepatobiliary: Nodular liver contour is compatible cirrhosis. 2.3 cm stone in the right upper quadrant in the region the gallbladder fossa is probably a gallstone although  gallbladder is not well demonstrated due to the large volume ascites, lack of intra-abdominal fat, and lack of intravenous contrast material. Again, although not well demonstrated there may be a defect in the anterior gallbladder wall within adjacent fluid collection anterior to the gallbladder (see axial image 57 of series 2. Another 1.7 cm stone is identified towards the midline compatible with distal common bile duct stone (58/2). Common bile duct is dilated up to 14 mm diameter. Pancreas: No focal mass lesion. No dilatation of the main duct. No intraparenchymal cyst. No peripancreatic edema. Spleen: No splenomegaly. No focal mass lesion. Adrenals/Urinary Tract: No adrenal nodule or mass. Left kidney atrophic with cystic disease. Large cystic mass in the right abdomen is similar to prior, likely a markedly hydronephrotic left kidney with overlying cortical thinning. Bladder is nondistended. Stomach/Bowel: Stomach is distended with fluid and contrast material. No findings to suggest small bowel obstruction. Moderate stool volume throughout the colon. Vascular/Lymphatic: There is advanced atherosclerotic calcification of the abdominal aorta without aneurysm. There is no gastrohepatic or hepatoduodenal ligament lymphadenopathy. No retroperitoneal or mesenteric lymphadenopathy. No pelvic sidewall lymphadenopathy. Reproductive: Unremarkable. Other: Large volume ascites. Supraumbilical ventral hernia contains fluid. Patient has large bilateral groin hernias. The right-sided groin hernia contains distal small bowel, proximal colon and ascites. Left groin hernia contains small bowel and ascites. Musculoskeletal: No worrisome lytic or sclerotic osseous abnormality. IMPRESSION: 1. Large volume ascites. 2. Cholelithiasis with a 2.3 cm gallstone in the right upper quadrant gallbladder is not well demonstrated due to the large volume ascites, lack of intra-abdominal fat, and lack of intravenous contrast material, but there  may be a defect in the anterior gallbladder wall with a irregular fluid collection just anterior to the gallbladder raising the question of but not definitive for gallbladder wall leak/perforation. Another 1.7 cm stone is identified in the distal common bile duct stone with common bile duct dilatation up to 14 mm diameter. Right upper quadrant ultrasound may prove helpful to further evaluate the gallbladder. 3. Large bilateral groin hernias containing bowel loops and ascites. Supraumbilical midline ventral hernia contains fluid. 4. Nodular liver contour compatible with cirrhosis. 5. Chronic consolidative opacity in the right base with thick walled inferior right pleural fluid collection. Appearance is similar to PET-CT of 10/17/2019. 6. Markedly hydronephrotic left kidney with overlying cortical thinning. Left renal atrophy. 7. Aortic Atherosclerosis (ICD10-I70.0). Electronically Signed   By: Misty Stanley M.D.   On: 11/14/2021 16:36    DVT Prophylaxis -SCD/Iv heparin AM Labs Ordered, also please review Full Orders  Family Communication: Admission, patients condition and plan of care including  tests being ordered have been discussed with the patient and daughter [name] who indicate understanding and agree with the plan   Condition  -stable  Roxan Hockey M.D on 11/15/2021 at 7:25 PM Go to [email or web address] -  for contact info  [facility] - Office  [phone]

## 2021-12-06 NOTE — ED Notes (Signed)
Family at bedside. 

## 2021-12-06 NOTE — ED Notes (Signed)
Pt pulled up in bed   

## 2021-12-06 NOTE — ED Notes (Signed)
Patient transported to CT 

## 2021-12-06 NOTE — ED Notes (Signed)
Date and time results received: 11/16/2021 2153   Test: pH ABG Critical Value: 7.1  Name of Provider Notified: Ashok Cordia, MD

## 2021-12-06 NOTE — ED Notes (Signed)
Critical care paged at this time. Kevin Walker

## 2021-12-06 NOTE — ED Notes (Signed)
EDP [facility] called time of death at 08-02-08.

## 2021-12-06 NOTE — ED Notes (Signed)
CBG 51

## 2021-12-06 NOTE — ED Notes (Signed)
CBG 22

## 2021-12-06 NOTE — ED Notes (Signed)
2005 Epi given 2007 shock 2008 suction - due to copious amoutn of dark brown emesis from pts mouth and nose 2009 shock 2010 lidocaine 2011 epi 2012 300mg  amio 2014 bicarb 2014 epi, CPR started 2016 shock 2017 epi, atropine 2019 D50 amp 2021 bicarb 2021 intubated  2022 shock 2023 amio 150mg  bolus 2023 shock 2024 shock 2025 shock 2045 IO right tib 2046 epi 2046 atropine 2049 epi, CPR restarted 2050 shock 2052 pads applied 2054 22G PIV right thumb 2057 VTACH - shock  2058 shock 2059 epi 2100 Afib 2105 CVC triple lumen placed left fem 2127 epi 2129 epi gtt started

## 2021-12-06 NOTE — Progress Notes (Addendum)
ANTICOAGULATION CONSULT NOTE - Initial Consult  Pharmacy Consult for heparin Indication: chest pain/ACS  No Known Allergies  Patient Measurements: Height: 5\' 11"  (180.3 cm) Weight: 79.4 kg (175 lb 0.7 oz) IBW/kg (Calculated) : 75.3 Heparin Dosing Weight: 79 kg  Vital Signs: Temp: 98.6 F (37 C) (07/04 1350) Temp Source: Oral (07/04 1350) BP: 85/58 (07/04 1502) Pulse Rate: 45 (07/04 1502)  Labs: Recent Labs    11/15/2021 1351 12/01/2021 1452  HGB 8.9*  --   HCT 26.5*  --   PLT 111*  --   CREATININE 6.58*  --   TROPONINIHS  --  634*    Estimated Creatinine Clearance: 11 mL/min (A) (by C-G formula based on SCr of 6.58 mg/dL (H)).   Medical History: Past Medical History:  Diagnosis Date   Anemia    low iron   CHF (congestive heart failure) (HCC)    Chronic kidney disease    Dialysis T/Th/Sa   Dyspnea    on exertion   GERD (gastroesophageal reflux disease)    Heart murmur    Hernia, abdominal    History of kidney stones    Hypertension    PTSD (post-traumatic stress disorder)    Sleep apnea    has a cpap - has not used it in past, but starting soon    Medications:  (Not in a hospital admission)   Assessment: Pharmacy consulted to dose heparin in patient with chest pain/ACS.  Patient is not on anticoagulation prior to admission.  Hgb 8.9 Trop 634  Goal of Therapy:  Heparin level 0.3-0.7 units/ml Monitor platelets by anticoagulation protocol: Yes   Plan:  Give 4000 units bolus x 1 Start heparin infusion at 900 units/hr Check anti-Xa level in 8 hours and daily while on heparin Continue to monitor H&H and platelets  Ramond Craver 11/11/2021,3:57 PM

## 2021-12-06 NOTE — ED Notes (Signed)
Pt's IV with Zosyn running occluded (kinked catheter) from pt bending wrist.  Attempting to obtain another IV site

## 2021-12-06 NOTE — Progress Notes (Signed)
ABG drawn and taken to lab 

## 2021-12-06 NOTE — Progress Notes (Signed)
Pharmacy Antibiotic Note  Kevin Walker is a 71 y.o. male admitted on 11/24/2021 with  intra-abdominal infection .  Pharmacy has been consulted for Zosyn dosing.  Plan: Zosyn 3.375g IV every 12 hours. Monitor labs, c/s, and patient improvement.  Height: 5\' 11"  (180.3 cm) Weight: 79.4 kg (175 lb 0.7 oz) IBW/kg (Calculated) : 75.3  Temp (24hrs), Avg:98.4 F (36.9 C), Min:98.1 F (36.7 C), Max:98.6 F (37 C)  Recent Labs  Lab 11/18/2021 1351  WBC 6.8  CREATININE 6.58*  LATICACIDVEN 1.3    Estimated Creatinine Clearance: 11 mL/min (A) (by C-G formula based on SCr of 6.58 mg/dL (H)).    No Known Allergies  Antimicrobials this admission: Zosyn 7/4 >>   Microbiology results: None pending  Thank you for allowing pharmacy to be a part of this patient's care.  Ramond Craver 11/24/2021 8:42 PM

## 2021-12-06 NOTE — ED Notes (Signed)
This RN entered room to check on pt due to hypotension at 2001, called for RN assistance as pt was unresponsive with agonal respirations. Multiple RNs came to assist, and notify EDP. Code Blue called at 2004.

## 2021-12-06 NOTE — ED Notes (Signed)
Pt got nauseated and vomited while at CT

## 2021-12-06 NOTE — ED Triage Notes (Signed)
Pt brought in by ems for c/o abdominal pain without n/v/d  Ems arrived on scene and checked cbg, 52. Oral glucose given and cbg rechecked with a decrease of 32. Another 15grams of glucose given and cbg at 51 just pta  Upon arrival to ed, cbg checked and read 21; pt alert and oriented and only complaint is abdominal pain  Pt states he did not go to dialysis on Saturday due to the abdominal pain. Pt's last dialysis was Thursday and he only had 2 hours that day  Pt states he did not get dialysis on Saturday due to low BP

## 2021-12-06 NOTE — ED Notes (Signed)
Provider at bedside

## 2021-12-06 NOTE — ED Provider Notes (Addendum)
Patient had been previously admitted by prior EDP to hospitalists at [facility]. Pt with hx esrd/hd, presented w c/o abd pain. Was found to have recurrent hypoglycemia, cirrhosis/ascites, and CT imaging w gallstones, CBD stone, and fluid collection adjacent to gallbladder/?GB leak, and elevated troponins.   I was called to room by RN as pt suddenly noted to be hypotensive and unresponsive. On entering room, pt not responsive, no pulse, large amount dark brown liquid coming from mouth.  Pt with PEA rhythm. CPR, IVF bolus/wide open, prep for rsi/intubation, epi.    Patient with episodes of vt, vfib, multiple shocks. Received d50, multipe rounds epi, Mg, Lido (while amio obtained), amio bolus/gtt.  Intubated w 8-0 ETT on first attempt  bil bs and co2 color change, stat pcxr confirms tube placement. Resp therapy at bedside. Suction ett.  With return pulses, hco3.  Family updated.   Intubated w 8-0 ETT on first attempt  bil bs and co2 color change, stat pcxr confirms tube placement. Resp therapy at bedside. Suction ett.   Multiple recurrent loss of pulses, vt/vfib, and bradypea episodes. Additional epi.  Bedside echo/u/s - extremely limited cardiac activity/squeeze.   With return of pulses, bp, left femoral v triple lumen placed. PCCM at [facility] contact. Dr Cicero Duck accepts patient.  Repeat istat 8 with k/lytes ok, hgb 9.5.   Pt with subsequent brady, hr 40. Epi gtt.   Pt in extremely critical condition/tenuous, with likelihood recovery exceedingly low.  W earlier arrest, suspect aspiration, and with length of CPR/resuscitative efforts, also expect anoxic brain injury. Family expresses desire to proceed w aggressive care.      Lajean Saver, MD 11/24/2021 [id number]  Procedure Name: Intubation Date/Time: 11/10/2021 11:18 AM  Performed by: Lajean Saver, MDOxygen Delivery Method: Non-rebreather mask Preoxygenation: Pre-oxygenation with 100% oxygen Laryngoscope Size: Glidescope Tube size: 8.0 mm Number of attempts:  1 Placement Confirmation: ETT inserted through vocal cords under direct vision, Breath sounds checked- equal and bilateral and CO2 detector Secured at: 24 cm Tube secured with: ETT holder Dental Injury: Teeth and Oropharynx as per pre-operative assessment  Comments: Emergent/code blue intubation.     Linus Salmons Line  Date/Time: 11/10/2021 11:20 AM  Performed by: Lajean Saver, MD Authorized by: Lajean Saver, MD   Consent:    Consent obtained:  Emergent situation Pre-procedure details:    Indication(s): central venous access and insufficient peripheral access     Indication(s) comment:  Code situation, loss of prior peripheral acccess, no iv access   Skin preparation:  Chlorhexidine and chlorhexidine with alcohol Sedation:    Sedation type: pt unresponsive, code situation. Procedure details:    Location:  L femoral   Site selection rationale:  Code situation....Marland Kitchenrecurrent bilious emesis about head/neck area   Procedural supplies:  Triple lumen   Landmarks identified: yes     Number of attempts:  1   Successful placement: yes (venous blood from each port, lines flushed w sterile saline)   Post-procedure details:    Post-procedure:  Dressing applied and line sutured   Assessment:  Blood return through all ports   Procedure completion:  Tolerated well, no immediate complications      [facility] was here to transport patient. Patient w recurrent episodes loss of pulses, cpr, additional meds. On epi gtt/full pressor support, profoundly hypotensive.   Neurologically shows no signs of life, pupils not responsive, no corneal, no response to stimuli, apneic.   Pt with asystole on montior. Pt pronounced dead at  at 2310-07-27. Staff calling family.  Lajean Saver, MD 11/22/2021 2314    CRITICAL CARE RE: acute resp failure, esrd/cirrhosis with biliary emesis/aspiration, hypotension Performed by: Mirna Mires Total critical care time: 45 minutes Critical care time was exclusive of  separately billable procedures and treating other patients. Critical care was necessary to treat or prevent imminent or life-threatening deterioration. Critical care was time spent personally by me on the following activities: development of treatment plan with patient and/or surrogate as well as nursing, discussions with consultants, evaluation of patient's response to treatment, examination of patient, obtaining history from patient or surrogate, ordering and performing treatments and interventions, ordering and review of laboratory studies, ordering and review of radiographic studies, pulse oximetry and re-evaluation of patient's condition.     Lajean Saver, MD 11/10/21 [phone]

## 2021-12-06 NOTE — ED Provider Notes (Signed)
[facility] Provider Note   CSN: [id number] Arrival date & time: 11/06/2021  1313     History  Chief Complaint  Patient presents with   Abdominal Pain    Kevin Walker is a 71 y.o. male.  HPI Patient presents for abdominal pain.  Medical history includes anemia, CHF, ESRD, GERD, HTN.  Onset of his pain was 1 week ago.  He has a longstanding ventral hernia which she has not noticed any changes to.  His pain has been in the area of his ventral hernia.  It is worsened with any type of abdominal muscle activation.  He states that pain is minimal at rest.  In addition to his pain, he has had loss of appetite.  He has had very little p.o. intake over the past week because of this.  He denies associated nausea.  He has not had vomiting.  Patient undergoes HD on T, TH, and SA.  He did not go on Saturday due to his abdominal pain.  On Thursday, he underwent 2 hours of dialysis.  Patient receives his dialysis and left arm fistula.  He does continue to make a small amount of urine that is described as a trickle every 2 days.  He has not had a bowel movement in the past week.  He attributes this to not eating.    Home Medications Prior to Admission medications   Medication Sig Start Date End Date Taking? Authorizing Provider  amLODipine (NORVASC) 10 MG tablet Take 10 mg by mouth daily in the afternoon.    Yes [provider]  B Complex-C-Folic Acid (DIALYVITE PO) Take 1 tablet by mouth daily in the afternoon.    Yes [provider]  bisacodyl (DULCOLAX) 5 MG EC tablet Take 5 mg by mouth daily in the afternoon.    Yes [provider]  bismuth subsalicylate (PEPTO BISMOL) 262 MG/15ML suspension Take 30 mLs by mouth every 6 (six) hours as needed for indigestion.   Yes [provider]  calcitRIOL (ROCALTROL) 0.25 MCG capsule Take 0.25 mcg by mouth 2 (two) times a week.   Yes [provider]  febuxostat (ULORIC) 40 MG tablet Take 40 mg by  mouth daily as needed (gout flare).   Yes [provider]  fluticasone (FLONASE) 50 MCG/ACT nasal spray Place 2 sprays into both nostrils daily.   Yes [provider]  furosemide (LASIX) 80 MG tablet Take 40 mg by mouth daily in the afternoon.   Yes [provider]  HYDROcodone-acetaminophen (NORCO) 7.5-325 MG tablet Take 1 tablet by mouth 2 (two) times daily as needed for moderate pain.   Yes [provider]  latanoprost (XALATAN) 0.005 % ophthalmic solution Place 1 drop into both eyes at bedtime.   Yes [provider]  lidocaine-prilocaine (EMLA) cream Apply 1 application topically as needed (for access).   Yes [provider]  naloxone (NARCAN) nasal spray 4 mg/0.1 mL Place 1 spray into the nose as needed (opioid overdose).   Yes [provider]  omeprazole (PRILOSEC) 20 MG capsule Take 20 mg by mouth 2 (two) times daily with a meal.   Yes [provider]  Polyvinyl Alcohol-Povidone (REFRESH OP) Place 1 drop into both eyes 2 (two) times a day.    Yes [provider]  sevelamer carbonate (RENVELA) 800 MG tablet Take 800 mg by mouth 3 (three) times daily with meals.   Yes [provider]  traZODone (DESYREL) 100 MG tablet Take 100 mg  by mouth at bedtime.   Yes [provider]  atenolol (TENORMIN) 50 MG tablet Take 50 mg by mouth daily in the afternoon.    [provider]  Elastic Bandages & Supports (COMPRESSION & SUPPORT GLOVES L) MISC 2 Devices by Does not apply route daily. 09/16/21   Broadus John, MD  Elastic Bandages & Supports (J-HOOK ARMSLEEVE) MISC 2 Devices by Does not apply route daily. 09/16/21   Broadus John, MD      Allergies    Patient has no known allergies.    Review of Systems   Review of Systems  Constitutional:  Positive for appetite change.  Gastrointestinal:  Positive for abdominal distention (Baseline), abdominal pain and constipation.  All other systems  reviewed and are negative.   Physical Exam Updated Vital Signs BP (!) 89/56   Pulse 65   Temp 98.6 F (37 C) (Oral)   Resp (!) 35   Ht 5\' 11"  (1.803 m)   Wt 79.4 kg   SpO2 97%   BMI 24.41 kg/m  Physical Exam Vitals and nursing note reviewed.  Constitutional:      General: He is not in acute distress.    Appearance: He is well-developed. He is not toxic-appearing or diaphoretic.  HENT:     Head: Normocephalic and atraumatic.     Mouth/Throat:     Mouth: Mucous membranes are moist.     Pharynx: Oropharynx is clear.  Eyes:     General: No scleral icterus.    Extraocular Movements: Extraocular movements intact.     Conjunctiva/sclera: Conjunctivae normal.  Cardiovascular:     Rate and Rhythm: Normal rate and regular rhythm.     Heart sounds: No murmur heard. Pulmonary:     Effort: Pulmonary effort is normal. No respiratory distress.     Breath sounds: Normal breath sounds. No wheezing or rales.  Chest:     Chest wall: No tenderness.  Abdominal:     Palpations: Abdomen is soft.     Tenderness: There is no abdominal tenderness.     Hernia: A hernia is present. Hernia is present in the ventral area.  Musculoskeletal:        General: No swelling.     Cervical back: Neck supple.  Skin:    General: Skin is warm and dry.  Neurological:     General: No focal deficit present.     Mental Status: He is alert and oriented to person, place, and time.  Psychiatric:        Mood and Affect: Mood normal.        Behavior: Behavior normal.     ED Results / Procedures / Treatments   Labs (all labs ordered are listed, but only abnormal results are displayed) Labs Reviewed  COMPREHENSIVE METABOLIC PANEL - Abnormal; Notable for the following components:      Result Value   Chloride 95 (*)    Glucose, Bld 31 (*)    BUN 68 (*)    Creatinine, Ser 6.58 (*)    Calcium 8.6 (*)    Albumin 2.4 (*)    Total Bilirubin 2.6 (*)    GFR, Estimated 8 (*)    All other components within  normal limits  CBC WITH DIFFERENTIAL/PLATELET - Abnormal; Notable for the following components:   RBC 3.23 (*)    Hemoglobin 8.9 (*)    HCT 26.5 (*)    Platelets 111 (*)    Lymphs Abs 0.1 (*)    All  other components within normal limits  ACETAMINOPHEN LEVEL - Abnormal; Notable for the following components:   Acetaminophen (Tylenol), Serum <10 (*)    All other components within normal limits  CBG MONITORING, ED - Abnormal; Notable for the following components:   Glucose-Capillary 21 (*)    All other components within normal limits  CBG MONITORING, ED - Abnormal; Notable for the following components:   Glucose-Capillary 22 (*)    All other components within normal limits  CBG MONITORING, ED - Abnormal; Notable for the following components:   Glucose-Capillary 53 (*)    All other components within normal limits  CBG MONITORING, ED - Abnormal; Notable for the following components:   Glucose-Capillary 48 (*)    All other components within normal limits  TROPONIN I (HIGH SENSITIVITY) - Abnormal; Notable for the following components:   Troponin I (High Sensitivity) 634 (*)    All other components within normal limits  LIPASE, BLOOD  LACTIC ACID, PLASMA  URINALYSIS, ROUTINE W REFLEX MICROSCOPIC  HEPARIN LEVEL (UNFRACTIONATED)  CBC  AMMONIA  COMPREHENSIVE METABOLIC PANEL  PROTIME-INR  HEMOGLOBIN A1C  CBG MONITORING, ED  I-STAT CHEM 8, ED  TROPONIN I (HIGH SENSITIVITY)    EKG EKG Interpretation  Date/Time:  Tuesday November 08 2021 13:49:08 EDT Ventricular Rate:  82 PR Interval:    QRS Duration: 118 QT Interval:  375 QTC Calculation: 438 R Axis:   -64 Text Interpretation: Undetermined rhythm Confirmed by Godfrey Pick [phone]) on 11/12/2021 2:58:51 PM  Radiology CT ABDOMEN PELVIS WO CONTRAST  Result Date: 11/07/2021 CLINICAL DATA:  Abdominal pain for 4 days. EXAM: CT ABDOMEN AND PELVIS WITHOUT CONTRAST TECHNIQUE: Multidetector CT imaging of the abdomen and pelvis was performed  following the standard protocol without IV contrast. RADIATION DOSE REDUCTION: This exam was performed according to the departmental dose-optimization program which includes automated exposure control, adjustment of the mA and/or kV according to patient size and/or use of iterative reconstruction technique. COMPARISON:  PET-CT 10/17/2019 FINDINGS: Lower chest: Chronic consolidative opacity in the right base with similar appearance of the. Chronic thick walled inferior right pleural fluid collection. Fluid in the esophagus is compatible with reflux or esophageal dysmotility. Heart is markedly enlarged. Bilateral gynecomastia. Hepatobiliary: Nodular liver contour is compatible cirrhosis. 2.3 cm stone in the right upper quadrant in the region the gallbladder fossa is probably a gallstone although gallbladder is not well demonstrated due to the large volume ascites, lack of intra-abdominal fat, and lack of intravenous contrast material. Again, although not well demonstrated there may be a defect in the anterior gallbladder wall within adjacent fluid collection anterior to the gallbladder (see axial image 57 of series 2. Another 1.7 cm stone is identified towards the midline compatible with distal common bile duct stone (58/2). Common bile duct is dilated up to 14 mm diameter. Pancreas: No focal mass lesion. No dilatation of the main duct. No intraparenchymal cyst. No peripancreatic edema. Spleen: No splenomegaly. No focal mass lesion. Adrenals/Urinary Tract: No adrenal nodule or mass. Left kidney atrophic with cystic disease. Large cystic mass in the right abdomen is similar to prior, likely a markedly hydronephrotic left kidney with overlying cortical thinning. Bladder is nondistended. Stomach/Bowel: Stomach is distended with fluid and contrast material. No findings to suggest small bowel obstruction. Moderate stool volume throughout the colon. Vascular/Lymphatic: There is advanced atherosclerotic calcification of the  abdominal aorta without aneurysm. There is no gastrohepatic or hepatoduodenal ligament lymphadenopathy. No retroperitoneal or mesenteric lymphadenopathy. No pelvic sidewall lymphadenopathy. Reproductive: Unremarkable. Other: Large  volume ascites. Supraumbilical ventral hernia contains fluid. Patient has large bilateral groin hernias. The right-sided groin hernia contains distal small bowel, proximal colon and ascites. Left groin hernia contains small bowel and ascites. Musculoskeletal: No worrisome lytic or sclerotic osseous abnormality. IMPRESSION: 1. Large volume ascites. 2. Cholelithiasis with a 2.3 cm gallstone in the right upper quadrant gallbladder is not well demonstrated due to the large volume ascites, lack of intra-abdominal fat, and lack of intravenous contrast material, but there may be a defect in the anterior gallbladder wall with a irregular fluid collection just anterior to the gallbladder raising the question of but not definitive for gallbladder wall leak/perforation. Another 1.7 cm stone is identified in the distal common bile duct stone with common bile duct dilatation up to 14 mm diameter. Right upper quadrant ultrasound may prove helpful to further evaluate the gallbladder. 3. Large bilateral groin hernias containing bowel loops and ascites. Supraumbilical midline ventral hernia contains fluid. 4. Nodular liver contour compatible with cirrhosis. 5. Chronic consolidative opacity in the right base with thick walled inferior right pleural fluid collection. Appearance is similar to PET-CT of 10/17/2019. 6. Markedly hydronephrotic left kidney with overlying cortical thinning. Left renal atrophy. 7. Aortic Atherosclerosis (ICD10-I70.0). Electronically Signed   By: Misty Stanley M.D.   On: 11/13/2021 16:36    Procedures Procedures    Medications Ordered in ED Medications  heparin ADULT infusion 100 units/mL (25000 units/255mL) (900 Units/hr Intravenous New Bag/Given 11/06/2021 1708)  lactated  ringers bolus 500 mL (has no administration in time range)  piperacillin-tazobactam (ZOSYN) IVPB 3.375 g (has no administration in time range)  dextrose 10 % infusion (has no administration in time range)  pantoprazole (PROTONIX) injection 40 mg (has no administration in time range)  dextrose 50 % solution 50 mL (50 mLs Intravenous Given 11/28/2021 1342)  dextrose (GLUTOSE) oral gel 40% (31 g Oral Given 11/12/2021 1331)  fentaNYL (SUBLIMAZE) injection 25 mcg (25 mcg Intravenous Given 11/26/2021 1455)  dextrose 10 % infusion ( Intravenous New Bag/Given 11/07/2021 1457)  aspirin chewable tablet 324 mg (324 mg Oral Given 11/26/2021 1612)  heparin bolus via infusion 4,000 Units (4,000 Units Intravenous Bolus from Bag 11/17/2021 1708)    ED Course/ Medical Decision Making/ A&P                           Medical Decision Making Amount and/or Complexity of Data Reviewed Labs: ordered. Radiology: ordered.  Risk OTC drugs. Prescription drug management.   This patient presents to the ED for concern of abdominal pain, this involves an extensive number of treatment options, and is a complaint that carries with it a high risk of complications and morbidity.  The differential diagnosis includes incarcerated hernia, colitis, cholecystitis, pancreatitis, constipation, PUD   Co morbidities that complicate the patient evaluation  anemia, CHF, ESRD, GERD, HTN   Additional history obtained:  Additional history obtained from N/A External records from outside source obtained and reviewed including EMR   Lab Tests:  I Ordered, and personally interpreted labs.  The pertinent results include: Hypoglycemia, baseline anemia, no leukocytosis, normal lactate, troponin is elevated at 634   Imaging Studies ordered:  I ordered imaging studies including CT of abdomen and pelvis I independently visualized and interpreted imaging which showed cholelithiasis, choledocholithiasis with CBD dilation to 14 mm, large volume ascites,  large bilateral groin hernias containing bowel loops and ascitic fluid, liver contour suggestive of cirrhosis, right pleural fluid collection. I agree with the radiologist  interpretation   Cardiac Monitoring: / EKG:  The patient was maintained on a cardiac monitor.  I personally viewed and interpreted the cardiac monitored which showed an underlying rhythm of: Atrial fibrillation (new)   Problem List / ED Course / Critical interventions / Medication management  Patient is a 71 year old male presenting for 1 week of abdominal pain.  Due to his abdominal pain, he was not able to undergo dialysis on Saturday.  He only underwent 2 hours of his session on Thursday.  He states that he has not been eating or drinking and attributes this to loss of appetite rather than pain.  He has not had a bowel movement in 6 days.  Patient is afebrile on arrival.  His breathing is unlabored and his SPO2 is normal on room air.  Blood glucose was checked and patient was found to be hypoglycemic with CBG of 21.  Despite this, he was alert and oriented.  This was prior to obtaining IV access and patient was able to take oral glucose.  Following this, he had persistent hypoglycemia and amp of D50 was given.  Despite this additional intervention, patient remained hypoglycemic.  D10 infusion was initiated.  He did have gradual improvement in his hyperglycemia and also endorsed feeling better.  Laboratory work-up and CT imaging of abdomen and pelvis was initiated.  On lab work, patient has a elevated troponin of 634 consistent with NSTEMI.  Given this finding in addition to new atrial fibrillation, heparin gtt. was initiated.  On a CT scan, patient had multiple findings.  He is found to have choledocholithiasis with CBD dilation.  Additionally, he has large volume ascites and liver contour consistent with cirrhosis.  This does help explain his persistent hypoglycemia.  On reassessment, patient did have gradually improving blood  glucose.  He stated that he remained pain-free at rest.  He was informed of his work-up results and is agreeable to admission.  Patient will require multidisciplinary management.  Given his choledocholithiasis, plan will be for admission to [facility] for ERCP capability.  This was discussed with hospitalist who admitted the patient for further management. I ordered medication including dextrose for hypoglycemia; heparin for NSTEMI and new atrial fibrillation; fentanyl for analgesia Reevaluation of the patient after these medicines showed that the patient improved I have reviewed the patients home medicines and have made adjustments as needed   Social Determinants of Health:  Multiple chronic medical conditions  CRITICAL CARE Performed by: Godfrey Pick   Total critical care time: 35 minutes  Critical care time was exclusive of separately billable procedures and treating other patients.  Critical care was necessary to treat or prevent imminent or life-threatening deterioration.  Critical care was time spent personally by me on the following activities: development of treatment plan with patient and/or surrogate as well as nursing, discussions with consultants, evaluation of patient's response to treatment, examination of patient, obtaining history from patient or surrogate, ordering and performing treatments and interventions, ordering and review of laboratory studies, ordering and review of radiographic studies, pulse oximetry and re-evaluation of patient's condition.         Final Clinical Impression(s) / ED Diagnoses Final diagnoses:  Hypoglycemia  Choledocholithiasis  NSTEMI (non-ST elevated myocardial infarction) [facility])    Rx / DC Orders ED Discharge Orders     None         Godfrey Pick, MD 11/12/2021 1737

## 2021-12-06 NOTE — ED Notes (Signed)
During bladder scan noted a baseball size ' abnormality in left groin advised RN MR

## 2021-12-06 NOTE — ED Notes (Signed)
Date and time results received: 11/06/2021 1545 (use smartphrase ".now" to insert current time)  Test: troponin Critical Value: 634  Name of Provider Notified: Dr Ashok Cordia  Orders Received? Or Actions Taken?:  no vo at this time

## 2021-12-06 NOTE — H&P (Incomplete)
NAME:  Kevin Walker, MRN:  [id number], DOB:  [birth year], LOS: 0 ADMISSION DATE:  12/02/2021, CONSULTATION DATE:  7/4 REFERRING MD:  Dr Denton Brick, CHIEF COMPLAINT:  Cardiac arrest   History of Present Illness:  Patient is encephalopathic and/or intubated. Therefore history has been obtained from chart review. 71 year old male with PMH as below, which is significant for ESRD on HD TTS, alcoholic cirrhosis with ascites, CHF, HTN, and OSA. He ha recently not been compliant with HD schedule. Last full session was 6/27 and he had a partial session 6/29. He presented to [facility] ED 7/4 with complaints of abdominal pain, nausea, vomiting, poor oral intake, and no BM for one week. Alert and oriented upon arrival. Pain is in the area of his known ventral hernia, but not changes appreciated to the hernia itself.  Labs significant for hypoglycemia refractory to standard therapies. CT of the abdomen demonstrated cholelithiasis, choledocholithiasis with CBD dilation to 79mm, and large volume ascites. There was concern for defect in the anterior wall of the gallbladder with irregular fluid collection adjacent. He was started on empiric antibiotics and referred for admission to [facility] for GI eval and possible MRCP.   Around 2100 he was noted by staff to be suddenly unresponsive and hypotensive. On EDP eval patient was pulseless PEA. ACLS initiated. Intubated with copious amount of brown fluid suctioned from airway. Multiple periods of ROSC with recurrent cardiac arrests. Periods of VT/VF s/p multiple defibrillations. Overall ACLS resuscitative measures estimated to have a duration of about 90 minutes. ROSC to sinus brady. Started on epinephrine infusion. PCCM asked to accept the patient in transfer to [facility] for ICU care.   Pertinent  Medical History   has a past medical history of Anemia, CHF (congestive heart failure) ([place]), Chronic kidney disease, Dyspnea, GERD (gastroesophageal reflux disease), Heart murmur,  Hernia, abdominal, History of kidney stones, Hypertension, PTSD (post-traumatic stress disorder), and Sleep apnea.   Significant Hospital Events: Including procedures, antibiotic start and stop dates in addition to other pertinent events   7/4 present for abd pain to AP. CT with ? GB perf. Patient initially stable, then coded for 90 mins.   Interim History / Subjective:    Objective   Blood pressure (!) 111/93, pulse 98, temperature 98.1 F (36.7 C), temperature source Oral, resp. rate 18, height 5\' 11"  (1.803 m), weight 79.4 kg, SpO2 (!) 77 %.    Vent Mode: PRVC FiO2 (%):  [100 %] 100 % Set Rate:  [20 bmp] 20 bmp Vt Set:  [600 mL] 600 mL   Intake/Output Summary (Last 24 hours) at 12/05/2021 2122 Last data filed at 11/21/2021 2119 Gross per 24 hour  Intake 550 ml  Output --  Net 550 ml   Filed Weights   11/21/2021 1335  Weight: 79.4 kg    Examination: General: *** HENT: *** Lungs: *** Cardiovascular: *** Abdomen: *** Extremities: *** Neuro: *** GU: ***  Resolved Hospital Problem list   ***  Assessment & Plan:  ***  Best Practice (right click and "Reselect all SmartList Selections" daily)   Diet/type: {diet type:25684} DVT prophylaxis: {anticoagulation (Optional):25687} GI prophylaxis: {QQ:22979} Lines: {Central Venous Access:25771} Foley:  {Central Venous Access:25691} Code Status:  {Code Status:26939} Last date of multidisciplinary goals of care discussion [***]  Labs   CBC: Recent Labs  Lab 11/12/2021 1351 11/28/2021 2112  WBC 6.8  --   NEUTROABS 6.5  --   HGB 8.9* 9.5*  HCT 26.5* 28.0*  MCV 82.0  --  PLT 111*  --     Basic Metabolic Panel: Recent Labs  Lab 11/29/2021 1351 11/07/2021 2112  NA 135 137  K 3.5 3.7  CL 95* 98  CO2 28  --   GLUCOSE 31* 121*  BUN 68* 64*  CREATININE 6.58* 6.50*  CALCIUM 8.6*  --    GFR: Estimated Creatinine Clearance: 11.1 mL/min (A) (by C-G formula based on SCr of 6.5 mg/dL (H)). Recent Labs  Lab 12/04/2021 1351   WBC 6.8  LATICACIDVEN 1.3    Liver Function Tests: Recent Labs  Lab 11/11/2021 1351  AST 31  ALT 27  ALKPHOS 49  BILITOT 2.6*  PROT 6.9  ALBUMIN 2.4*   Recent Labs  Lab 11/26/2021 1351  LIPASE 17   Recent Labs  Lab 11/21/2021 1819  AMMONIA 30    ABG    Component Value Date/Time   TCO2 24 11/05/2021 2112     Coagulation Profile: No results for input(s): "INR", "PROTIME" in the last 168 hours.  Cardiac Enzymes: No results for input(s): "CKTOTAL", "CKMB", "CKMBINDEX", "TROPONINI" in the last 168 hours.  HbA1C: No results found for: "HGBA1C"  CBG: Recent Labs  Lab 12/04/2021 1359 11/29/2021 1507 11/25/2021 1637 11/18/2021 1957 11/21/2021 2036  GLUCAP 53* 48* 74 72 62*    Review of Systems:   Patient is encephalopathic and/or intubated. Therefore history has been obtained from chart review.   Past Medical History:  He,  has a past medical history of Anemia, CHF (congestive heart failure) ([place]), Chronic kidney disease, Dyspnea, GERD (gastroesophageal reflux disease), Heart murmur, Hernia, abdominal, History of kidney stones, Hypertension, PTSD (post-traumatic stress disorder), and Sleep apnea.   Surgical History:   Past Surgical History:  Procedure Laterality Date   A/V FISTULAGRAM N/A 06/28/2018   Procedure: A/V FISTULAGRAM;  Surgeon: Elam Dutch, MD;  Location: [place] [facility];  Service: Cardiovascular;  Laterality: N/A;   A/V FISTULAGRAM Left 04/23/2019   Procedure: A/V FISTULAGRAM;  Surgeon: Marty Heck, MD;  Location: [place] [facility];  Service: Cardiovascular;  Laterality: Left;   AV FISTULA PLACEMENT Right    AV FISTULA PLACEMENT Left    this one never used   AV FISTULA PLACEMENT Left 08/28/2018   Procedure: Arteriovenous (Av) Fistula Creation;  Surgeon: Rosetta Posner, MD;  Location: [place];  Service: Vascular;  Laterality: Left;   [place] Left 11/11/2018   Procedure: SECOND STAGE BASILIC VEIN TRANSPOSITION LEFT ARM;   Surgeon: Rosetta Posner, MD;  Location: [place];  Service: Vascular;  Laterality: Left;   CARDIAC CATHETERIZATION     performed at the [facility] in [place]   COLONOSCOPY     FISTULOGRAM Right 06/11/2017   Procedure: FISTULOGRAM RIGHT ARTERIOVENOUS FISTULA WITH INTERVENTION;  Surgeon: Conrad South Fulton, MD;  Location: [place];  Service: Vascular;  Laterality: Right;   INSERTION OF DIALYSIS CATHETER     IR GENERIC HISTORICAL  12/02/2015   IR FLUORO GUIDE CV LINE LEFT 12/02/2015 Jacqulynn Cadet, MD [facility]   IR GENERIC HISTORICAL  03/06/2016   IR REMOVAL TUN CV CATH W/O FL 03/06/2016 Markus Daft, MD [facility]   PERIPHERAL VASCULAR BALLOON ANGIOPLASTY  04/23/2019   Procedure: PERIPHERAL VASCULAR BALLOON ANGIOPLASTY;  Surgeon: Marty Heck, MD;  Location: [place] [facility];  Service: Cardiovascular;;  Left AVF   PERIPHERAL VASCULAR BALLOON ANGIOPLASTY  09/28/2021   Procedure: PERIPHERAL VASCULAR BALLOON ANGIOPLASTY;  Surgeon: Broadus John, MD;  Location: [place] [facility];  Service:  Cardiovascular;;   PERIPHERAL VASCULAR THROMBECTOMY  09/28/2021   Procedure: PERIPHERAL VASCULAR THROMBECTOMY;  Surgeon: Broadus John, MD;  Location: [place] [facility];  Service: Cardiovascular;;  Left AVF   TEMPORARY DIALYSIS CATHETER  06/28/2018   Procedure: TEMPORARY DIALYSIS CATHETER;  Surgeon: Elam Dutch, MD;  Location: [place] [facility];  Service: Cardiovascular;;  Right Subclavian Dialysis catheter insertion     Social History:   reports that he has never smoked. He has never used smokeless tobacco. He reports that he does not drink alcohol and does not use drugs.   Family History:  His family history includes Diabetes in his mother; Heart disease in his father and mother.   Allergies No Known Allergies   Home Medications  Prior to Admission medications   Medication Sig Start Date End Date Taking? Authorizing Provider  amLODipine (NORVASC) 10 MG tablet Take 10 mg by mouth daily in the  afternoon.    Yes [provider]  B Complex-C-Folic Acid (DIALYVITE PO) Take 1 tablet by mouth daily in the afternoon.    Yes [provider]  bisacodyl (DULCOLAX) 5 MG EC tablet Take 5 mg by mouth daily in the afternoon.    Yes [provider]  bismuth subsalicylate (PEPTO BISMOL) 262 MG/15ML suspension Take 30 mLs by mouth every 6 (six) hours as needed for indigestion.   Yes [provider]  calcitRIOL (ROCALTROL) 0.25 MCG capsule Take 0.25 mcg by mouth 2 (two) times a week.   Yes [provider]  febuxostat (ULORIC) 40 MG tablet Take 40 mg by mouth daily as needed (gout flare).   Yes [provider]  fluticasone (FLONASE) 50 MCG/ACT nasal spray Place 2 sprays into both nostrils daily.   Yes [provider]  furosemide (LASIX) 80 MG tablet Take 40 mg by mouth daily in the afternoon.   Yes [provider]  HYDROcodone-acetaminophen (NORCO) 7.5-325 MG tablet Take 1 tablet by mouth 2 (two) times daily as needed for moderate pain.   Yes [provider]  latanoprost (XALATAN) 0.005 % ophthalmic solution Place 1 drop into both eyes at bedtime.   Yes [provider]  lidocaine-prilocaine (EMLA) cream Apply 1 application topically as needed (for access).   Yes [provider]  naloxone (NARCAN) nasal spray 4 mg/0.1 mL Place 1 spray into the nose as needed (opioid overdose).   Yes [provider]  omeprazole (PRILOSEC) 20 MG capsule Take 20 mg by mouth 2 (two) times daily with a meal.   Yes [provider]  Polyvinyl Alcohol-Povidone (REFRESH OP) Place 1 drop into both eyes 2 (two) times a day.    Yes [provider]  sevelamer carbonate (RENVELA) 800 MG tablet Take 800 mg by mouth 3 (three) times daily with meals.   Yes [provider]  traZODone (DESYREL) 100 MG tablet Take 100 mg by mouth at bedtime.   Yes [provider]  atenolol (TENORMIN) 50 MG tablet Take  50 mg by mouth daily in the afternoon.    [provider]  Elastic Bandages & Supports (COMPRESSION & SUPPORT GLOVES L) MISC 2 Devices by Does not apply route daily. 09/16/21   Broadus John, MD  Elastic Bandages & Supports (J-HOOK ARMSLEEVE) MISC 2 Devices by Does not apply route daily. 09/16/21   Broadus John, MD     Critical care time: ***

## 2021-12-06 NOTE — ED Notes (Signed)
Pt and family member states that neck pain, loss of movement and stiffness started last Tuesday. Pain and stiffness has gotten progressively worse

## 2021-12-06 DEATH — deceased

## 2022-01-05 ENCOUNTER — Telehealth: Payer: Self-pay | Admitting: *Deleted

## 2022-01-05 NOTE — Telephone Encounter (Signed)
Received fax from [facility] [phone]- [phone]~ fax.   Medical records requested: chart notes, H&P,  imaging reports, lab results, etc  Medical Record Request ID: [id number]  Records from [place] faxed to [facility] and confirmation received.
